# Patient Record
Sex: Female | Born: 1990 | Race: Black or African American | Hispanic: No | Marital: Single | Smoking: Never smoker
Health system: Southern US, Community
[De-identification: ages and names within clinical notes are randomized; demographics above are authoritative.]

## PROBLEM LIST (undated history)

## (undated) ENCOUNTER — Inpatient Hospital Stay (HOSPITAL_COMMUNITY): Payer: Self-pay

## (undated) DIAGNOSIS — I10 Essential (primary) hypertension: Secondary | ICD-10-CM

---

## 2017-07-30 ENCOUNTER — Encounter: Payer: Self-pay | Admitting: *Deleted

## 2017-08-12 LAB — OB RESULTS CONSOLE GC/CHLAMYDIA: Gonorrhea: NEGATIVE

## 2017-08-25 ENCOUNTER — Encounter: Payer: Medicaid Other | Admitting: Advanced Practice Midwife

## 2017-08-25 ENCOUNTER — Encounter: Payer: Self-pay | Admitting: Advanced Practice Midwife

## 2017-09-04 ENCOUNTER — Encounter (HOSPITAL_COMMUNITY): Payer: Self-pay

## 2017-09-04 ENCOUNTER — Inpatient Hospital Stay (HOSPITAL_COMMUNITY)
Admission: AD | Admit: 2017-09-04 | Discharge: 2017-09-05 | Disposition: A | Discharge status: Home / Self Care | Payer: Medicaid Other | Source: Ambulatory Visit | Attending: Obstetrics and Gynecology | Admitting: Obstetrics and Gynecology

## 2017-09-04 DIAGNOSIS — O209 Hemorrhage in early pregnancy, unspecified: Secondary | ICD-10-CM | POA: Insufficient documentation

## 2017-09-04 DIAGNOSIS — Z6791 Unspecified blood type, Rh negative: Secondary | ICD-10-CM

## 2017-09-04 DIAGNOSIS — Z3A10 10 weeks gestation of pregnancy: Secondary | ICD-10-CM | POA: Insufficient documentation

## 2017-09-04 DIAGNOSIS — O208 Other hemorrhage in early pregnancy: Secondary | ICD-10-CM | POA: Diagnosis not present

## 2017-09-04 HISTORY — DX: Essential (primary) hypertension: I10

## 2017-09-04 LAB — WET PREP, GENITAL
Clue Cells Wet Prep HPF POC: NONE SEEN
Sperm: NONE SEEN
Trich, Wet Prep: NONE SEEN
Yeast Wet Prep HPF POC: NONE SEEN

## 2017-09-04 LAB — CBC
HEMATOCRIT: 29.2 % — AB (ref 36.0–46.0)
Hemoglobin: 9.4 g/dL — ABNORMAL LOW (ref 12.0–15.0)
MCH: 23 pg — AB (ref 26.0–34.0)
MCHC: 32.2 g/dL (ref 30.0–36.0)
MCV: 71.4 fL — AB (ref 78.0–100.0)
Platelets: 324 10*3/uL (ref 150–400)
RBC: 4.09 MIL/uL (ref 3.87–5.11)
RDW: 22.4 % — AB (ref 11.5–15.5)
WBC: 11 10*3/uL — AB (ref 4.0–10.5)

## 2017-09-04 LAB — URINALYSIS, ROUTINE W REFLEX MICROSCOPIC

## 2017-09-04 LAB — POCT PREGNANCY, URINE: Preg Test, Ur: POSITIVE — AB

## 2017-09-04 LAB — URINALYSIS, MICROSCOPIC (REFLEX): RBC / HPF: >50 RBC/hpf (ref 0–5)

## 2017-09-04 LAB — ABO/RH: ABO/RH(D): A NEG

## 2017-09-04 MED ORDER — RHO D IMMUNE GLOBULIN 1500 UNIT/2ML IJ SOSY
300.0000 ug | PREFILLED_SYRINGE | Freq: Once | INTRAMUSCULAR | Status: AC
  Administered 2017-09-04: 300 ug via INTRAMUSCULAR
  Filled 2017-09-04: qty 2

## 2017-09-04 NOTE — MAU Provider Note (Signed)
History     CSN: [id number]  Arrival date and time: 09/04/17 2002   First Provider Initiated Contact with Patient 09/04/17 2124      Chief Complaint  Patient presents with  . Vaginal Bleeding   HPI Cheryl Nolan is a 27 y.o. G2P1000 at 6590w5d who presents with vaginal bleeding. She states she noticed bright red vaginal bleeding today when she wipes. Denies any pain. She reports having an ultrasound in the office at 7 weeks that confirmed an IUP with a heartbeat.   OB History    Gravida  2   Para  1   Term  1   Preterm      AB      Living        SAB      TAB      Ectopic      Multiple      Live Births              Past Medical History:  Diagnosis Date  . Hypertension     Past Surgical History:  Procedure Laterality Date  . CESAREAN SECTION      No family history on file.  Social History   Tobacco Use  . Smoking status: Never Smoker  . Smokeless tobacco: Never Used  Substance Use Topics  . Alcohol use: Not Currently  . Drug use: Never    Allergies: No Known Allergies  No medications prior to admission.    Review of Systems  Constitutional: Negative.  Negative for fatigue and fever.  HENT: Negative.   Respiratory: Negative.  Negative for shortness of breath.   Cardiovascular: Negative.  Negative for chest pain.  Gastrointestinal: Negative.  Negative for abdominal pain, constipation, diarrhea, nausea and vomiting.  Genitourinary: Positive for vaginal bleeding. Negative for dysuria.  Neurological: Negative.  Negative for dizziness and headaches.   Physical Exam   Blood pressure 118/77, pulse 99, temperature 99.6 F (37.6 C), temperature source Oral, resp. rate 18, height 5\' 1"  (1.549 m), weight 122.9 kg, last menstrual period 06/21/2017, SpO2 100 %.  Physical Exam  Nursing note and vitals reviewed. Constitutional: She is oriented to person, place, and time. She appears well-developed and well-nourished. No distress.  HENT:  Head:  Normocephalic.  Eyes: Pupils are equal, round, and reactive to light.  Cardiovascular: Normal rate, regular rhythm and normal heart sounds.  Respiratory: Effort normal and breath sounds normal. No respiratory distress.  GI: Soft. Bowel sounds are normal. She exhibits no distension. There is no tenderness.  Genitourinary:  Genitourinary Comments: Pelvic exam: Cervix pink, visually closed, without lesion, scant bright red bleeding at os, vaginal walls and external genitalia normal Bimanual exam: Cervix 0/long/high, firm, anterior, neg CMT, uterus nontender, adnexa without tenderness, enlargement, or mass   Neurological: She is alert and oriented to person, place, and time.  Skin: Skin is warm and dry.  Psychiatric: She has a normal mood and affect. Her behavior is normal. Judgment and thought content normal.    MAU Course  Procedures Results for orders placed or performed during the hospital encounter of 09/04/17 (from the past 24 hour(s))  Urinalysis, Routine w reflex microscopic     Status: Abnormal   Collection Time: 09/04/17  8:23 PM  Result Value Ref Range   Color, Urine RED (A) YELLOW   APPearance TURBID (A) CLEAR   Specific Gravity, Urine  1.005 - 1.030    TEST NOT REPORTED DUE TO COLOR INTERFERENCE OF URINE PIGMENT   pH  5.0 - 8.0    TEST NOT REPORTED DUE TO COLOR INTERFERENCE OF URINE PIGMENT   Glucose, UA (A) NEGATIVE mg/dL    TEST NOT REPORTED DUE TO COLOR INTERFERENCE OF URINE PIGMENT   Hgb urine dipstick (A) NEGATIVE    TEST NOT REPORTED DUE TO COLOR INTERFERENCE OF URINE PIGMENT   Bilirubin Urine (A) NEGATIVE    TEST NOT REPORTED DUE TO COLOR INTERFERENCE OF URINE PIGMENT   Ketones, ur (A) NEGATIVE mg/dL    TEST NOT REPORTED DUE TO COLOR INTERFERENCE OF URINE PIGMENT   Protein, ur (A) NEGATIVE mg/dL    TEST NOT REPORTED DUE TO COLOR INTERFERENCE OF URINE PIGMENT   Nitrite (A) NEGATIVE    TEST NOT REPORTED DUE TO COLOR INTERFERENCE OF URINE PIGMENT   Leukocytes, UA  (A) NEGATIVE    TEST NOT REPORTED DUE TO COLOR INTERFERENCE OF URINE PIGMENT  Urinalysis, Microscopic (reflex)     Status: Abnormal   Collection Time: 09/04/17  8:23 PM  Result Value Ref Range   RBC / HPF >50 0 - 5 RBC/hpf   WBC, UA 6-10 0 - 5 WBC/hpf   Bacteria, UA FEW (A) NONE SEEN   Squamous Epithelial / LPF 0-5 0 - 5   Urine-Other MICROSCOPIC EXAM PERFORMED ON UNCONCENTRATED URINE   Pregnancy, urine POC     Status: Abnormal   Collection Time: 09/04/17  8:40 PM  Result Value Ref Range   Preg Test, Ur POSITIVE (A) NEGATIVE  CBC     Status: Abnormal   Collection Time: 09/04/17  9:35 PM  Result Value Ref Range   WBC 11.0 (H) 4.0 - 10.5 K/uL   RBC 4.09 3.87 - 5.11 MIL/uL   Hemoglobin 9.4 (L) 12.0 - 15.0 g/dL   HCT 95.2 (L) 84.1 - 32.4 %   MCV 71.4 (L) 78.0 - 100.0 fL   MCH 23.0 (L) 26.0 - 34.0 pg   MCHC 32.2 30.0 - 36.0 g/dL   RDW 40.1 (H) 02.7 - 25.3 %   Platelets 324 150 - 400 K/uL  Rh IG workup (includes ABO/Rh)     Status: None (Preliminary result)   Collection Time: 09/04/17  9:36 PM  Result Value Ref Range   Gestational Age(Wks) 10.5    ABO/RH(D) A NEG    Antibody Screen NEG    Unit Number [id number]    Blood Component Type RHIG    Unit division 00    Status of Unit ISSUED    Transfusion Status      OK TO TRANSFUSE Performed at [facility], [address], [place], [place] [address]   ABO/Rh     Status: None   Collection Time: 09/04/17  9:36 PM  Result Value Ref Range   ABO/RH(D)      A NEG Performed at [facility], [address] [place], [place] [address]   Wet prep, genital     Status: Abnormal   Collection Time: 09/04/17  9:49 PM  Result Value Ref Range   Yeast Wet Prep HPF POC NONE SEEN NONE SEEN   Trich, Wet Prep NONE SEEN NONE SEEN   Clue Cells Wet Prep HPF POC NONE SEEN NONE SEEN   WBC, Wet Prep HPF POC MODERATE (A) NONE SEEN   Sperm NONE SEEN    MDM Prenatal records from private office not on file Labs ordered and  reviewed.  CBC Rhogam work up [employer] prep and gc/chlamydia Bedside u/s confirmed IUP with FHR 162 bpm  Consulted with  Dr. Dareen PianoAnderson- ok to discharge patient home to follow up in the office.   Assessment and Plan   1. Vaginal bleeding affecting early pregnancy   2. [redacted] weeks gestation of pregnancy   3. Blood type, Rh negative    -Discharge home in stable condition -Vaginal bleeding precautions discussed -Patient [facility] -Patient may return to MAU as needed or if her condition were to change or worsen  Rolm BookbinderCaroline M Gust Eugene CNM 09/04/2017, 9:25 PM

## 2017-09-04 NOTE — MAU Note (Signed)
Pt states she is 10.[redacted] weeks pregnant. States she went to the bathroom and noticed a golf ball sized clot. Pt states she is now having bright red bleeding like a light period. Pt reports some mild cramping, rates 2/10. LMP: 06/21/2017.

## 2017-09-04 NOTE — Discharge Instructions (Signed)

## 2017-09-04 NOTE — MAU Note (Signed)
Urine in lab 

## 2017-09-05 LAB — RH IG WORKUP (INCLUDES ABO/RH)
ABO/RH(D): A NEG
ANTIBODY SCREEN: NEGATIVE
Gestational Age(Wks): 10.5
UNIT DIVISION: 0

## 2017-09-05 LAB — GC/CHLAMYDIA PROBE AMP (~~LOC~~) NOT AT ARMC
Chlamydia: NEGATIVE
NEISSERIA GONORRHEA: NEGATIVE

## 2017-09-26 LAB — OB RESULTS CONSOLE HEPATITIS B SURFACE ANTIGEN: Hepatitis B Surface Ag: NEGATIVE

## 2017-09-26 LAB — OB RESULTS CONSOLE RUBELLA ANTIBODY, IGM: Rubella: NON-IMMUNE/NOT IMMUNE

## 2017-09-26 LAB — OB RESULTS CONSOLE ABO/RH: RH Type: POSITIVE

## 2017-09-26 LAB — OB RESULTS CONSOLE HIV ANTIBODY (ROUTINE TESTING): HIV: NONREACTIVE

## 2017-09-26 LAB — OB RESULTS CONSOLE RPR: RPR: NONREACTIVE

## 2017-11-17 ENCOUNTER — Encounter (HOSPITAL_COMMUNITY): Payer: Self-pay | Admitting: *Deleted

## 2017-11-17 ENCOUNTER — Other Ambulatory Visit: Payer: Self-pay

## 2017-11-17 ENCOUNTER — Inpatient Hospital Stay (HOSPITAL_COMMUNITY)
Admission: AD | Admit: 2017-11-17 | Discharge: 2017-11-17 | Disposition: A | Discharge status: Home / Self Care | Payer: Medicaid Other | Source: Ambulatory Visit | Attending: Obstetrics | Admitting: Obstetrics

## 2017-11-17 DIAGNOSIS — Z3A21 21 weeks gestation of pregnancy: Secondary | ICD-10-CM | POA: Diagnosis not present

## 2017-11-17 DIAGNOSIS — R102 Pelvic and perineal pain unspecified side: Secondary | ICD-10-CM

## 2017-11-17 DIAGNOSIS — O26892 Other specified pregnancy related conditions, second trimester: Secondary | ICD-10-CM | POA: Insufficient documentation

## 2017-11-17 DIAGNOSIS — R109 Unspecified abdominal pain: Secondary | ICD-10-CM | POA: Diagnosis present

## 2017-11-17 DIAGNOSIS — O26899 Other specified pregnancy related conditions, unspecified trimester: Secondary | ICD-10-CM

## 2017-11-17 LAB — URINALYSIS, ROUTINE W REFLEX MICROSCOPIC
BILIRUBIN URINE: NEGATIVE
GLUCOSE, UA: NEGATIVE mg/dL
Hgb urine dipstick: NEGATIVE
KETONES UR: 5 mg/dL — AB
Nitrite: NEGATIVE
PH: 5 (ref 5.0–8.0)
Protein, ur: 30 mg/dL — AB
Specific Gravity, Urine: 1.029 (ref 1.005–1.030)

## 2017-11-17 LAB — WET PREP, GENITAL
Clue Cells Wet Prep HPF POC: NONE SEEN
Sperm: NONE SEEN
Trich, Wet Prep: NONE SEEN
Yeast Wet Prep HPF POC: NONE SEEN

## 2017-11-17 NOTE — Discharge Instructions (Signed)
Round Ligament Pain °The round ligament is a cord of muscle and tissue that helps to support the uterus. It can become a source of pain during pregnancy if it becomes stretched or twisted as the baby grows. The pain usually begins in the second trimester of pregnancy, and it can come and go until the baby is delivered. It is not a serious problem, and it does not cause harm to the baby. °Round ligament pain is usually a short, sharp, and pinching pain, but it can also be a dull, lingering, and aching pain. The pain is felt in the lower side of the abdomen or in the groin. It usually starts deep in the groin and moves up to the outside of the hip area. Pain can occur with: °· A sudden change in position. °· Rolling over in bed. °· Coughing or sneezing. °· Physical activity. ° °Follow these instructions at home: °Watch your condition for any changes. Take these steps to help with your pain: °· When the pain starts, relax. Then try: °? Sitting down. °? Flexing your knees up to your abdomen. °? Lying on your side with one pillow under your abdomen and another pillow between your legs. °? Sitting in a warm bath for 15-20 minutes or until the pain goes away. °· Take over-the-counter and prescription medicines only as told by your health care provider. °· Move slowly when you sit and stand. °· Avoid long walks if they cause pain. °· Stop or lessen your physical activities if they cause pain. ° °Contact a health care provider if: °· Your pain does not go away with treatment. °· You feel pain in your back that you did not have before. °· Your medicine is not helping. °Get help right away if: °· You develop a fever or chills. °· You develop uterine contractions. °· You develop vaginal bleeding. °· You develop nausea or vomiting. °· You develop diarrhea. °· You have pain when you urinate. °This information is not intended to replace advice given to you by your health care provider. Make sure you discuss any questions you have  with your health care provider. °Document Released: 10/17/2007 Document Revised: 06/15/2015 Document Reviewed: 03/16/2014 °Elsevier Interactive Patient Education © 2018 [employer] ° °Safe Medications in Pregnancy  ° °Acne: °Benzoyl Peroxide °Salicylic Acid ° °Backache/Headache: °Tylenol: 2 regular strength every 4 hours OR °             2 Extra strength every 6 hours ° °Colds/Coughs/Allergies: °Benadryl (alcohol free) 25 mg every 6 hours as needed °Breath right strips °Claritin °Cepacol throat lozenges °Chloraseptic throat spray °Cold-Eeze- up to three times per day °Cough drops, alcohol free °Flonase (by prescription only) °Guaifenesin °Mucinex °Robitussin DM (plain only, alcohol free) °Saline nasal spray/drops °Sudafed (pseudoephedrine) & Actifed ** use only after [redacted] weeks gestation and if you do not have high blood pressure °Tylenol °Vicks Vaporub °Zinc lozenges °Zyrtec  ° °Constipation: °Colace °Ducolax suppositories °Fleet enema °Glycerin suppositories °Metamucil °Milk of magnesia °Miralax °Senokot °Smooth move tea ° °Diarrhea: °Kaopectate °Imodium A-D ° °*NO pepto Bismol ° °Hemorrhoids: °Anusol °Anusol HC °Preparation H °Tucks ° °Indigestion: °Tums °Maalox °Mylanta °Zantac  °Pepcid ° °Insomnia: °Benadryl (alcohol free) 25mg every 6 hours as needed °Tylenol PM °Unisom, no Gelcaps ° °Leg Cramps: °Tums °MagGel ° °Nausea/Vomiting:  °Bonine °Dramamine °Emetrol °Ginger extract °Sea bands °Meclizine  °Nausea medication to take during pregnancy:  °Unisom (doxylamine succinate 25 mg tablets) Take one tablet daily at bedtime. If symptoms are not adequately controlled,   the dose can be increased to a maximum recommended dose of two tablets daily (1/2 tablet in the morning, 1/2 tablet mid-afternoon and one at bedtime). °Vitamin B6 100mg tablets. Take one tablet twice a day (up to 200 mg per day). ° °Skin Rashes: °Aveeno products °Benadryl cream or 25mg every 6 hours as needed °Calamine Lotion °1% cortisone  cream ° °Yeast infection: °Gyne-lotrimin 7 °Monistat 7 ° ° °**If taking multiple medications, please check labels to avoid duplicating the same active ingredients °**take medication as directed on the label °** Do not exceed 4000 mg of tylenol in 24 hours °**Do not take medications that contain aspirin or ibuprofen ° ° ° ° °

## 2017-11-17 NOTE — MAU Note (Signed)
First it was like pressure, now is feeling like cramping in her lower abd, really hurts. No bleeding or leaking.

## 2017-11-17 NOTE — MAU Provider Note (Addendum)
History     CSN: [id number]  Arrival date and time: 11/17/17 1231   First Provider Initiated Contact with Patient 11/17/17 1258      Chief Complaint  Patient presents with  . Abdominal Pain   HPI Cheryl Nolan is a 27 y.o. G2P1000 at 108w2d who presents with lower abdominal pressure and cramping. She states she spoke with her OB who told her the pressure was normal but started having cramping today. She rates the cramping a 3/10 and has not tried anything for the pain. She states the pain is worse with movement and when she changes positions. She denies any vaginal bleeding or discharge. Reports normal fetal movement. She reports one glass of water today.   She states she had a normal anatomy ultrasound with a posterior placenta location.   OB History    Gravida  2   Para  1   Term  1   Preterm      AB      Living        SAB      TAB      Ectopic      Multiple      Live Births              Past Medical History:  Diagnosis Date  . Hypertension     Past Surgical History:  Procedure Laterality Date  . CESAREAN SECTION      History reviewed. No pertinent family history.  Social History   Tobacco Use  . Smoking status: Never Smoker  . Smokeless tobacco: Never Used  Substance Use Topics  . Alcohol use: Not Currently  . Drug use: Never    Allergies: No Known Allergies  No medications prior to admission.    Review of Systems  Constitutional: Negative.  Negative for fatigue and fever.  HENT: Negative.   Respiratory: Negative.  Negative for shortness of breath.   Cardiovascular: Negative.  Negative for chest pain.  Gastrointestinal: Positive for abdominal pain. Negative for constipation, diarrhea, nausea and vomiting.  Genitourinary: Negative.  Negative for dysuria, frequency, vaginal bleeding and vaginal discharge.  Musculoskeletal: Negative for back pain.  Neurological: Negative.  Negative for dizziness and headaches.   Physical Exam    Blood pressure 132/62, pulse 98, temperature 98.1 F (36.7 C), temperature source Oral, resp. rate 18, weight 123.9 kg, last menstrual period 06/21/2017, SpO2 99 %.  Physical Exam  Nursing note and vitals reviewed. Constitutional: She is oriented to person, place, and time. She appears well-developed and well-nourished. No distress.  HENT:  Head: Normocephalic.  Eyes: Pupils are equal, round, and reactive to light.  Cardiovascular: Normal rate, regular rhythm and normal heart sounds.  Respiratory: Effort normal and breath sounds normal. No respiratory distress.  GI: Soft. Bowel sounds are normal. She exhibits no distension. There is no tenderness. There is no rebound and no guarding.  Neurological: She is alert and oriented to person, place, and time.  Skin: Skin is warm and dry.  Psychiatric: She has a normal mood and affect. Her behavior is normal. Judgment and thought content normal.   Dilation: Closed Effacement (%): Thick Cervical Position: Posterior Exam by:: Ma Hillock CNM  FHT: 154 bpm  MAU Course  Procedures Results for orders placed or performed during the hospital encounter of 11/17/17 (from the past 24 hour(s))  Urinalysis, Routine w reflex microscopic     Status: Abnormal   Collection Time: 11/17/17 12:55 PM  Result Value Ref Range   Color,  Urine YELLOW YELLOW   APPearance HAZY (A) CLEAR   Specific Gravity, Urine 1.029 1.005 - 1.030   pH 5.0 5.0 - 8.0   Glucose, UA NEGATIVE NEGATIVE mg/dL   Hgb urine dipstick NEGATIVE NEGATIVE   Bilirubin Urine NEGATIVE NEGATIVE   Ketones, ur 5 (A) NEGATIVE mg/dL   Protein, ur 30 (A) NEGATIVE mg/dL   Nitrite NEGATIVE NEGATIVE   Leukocytes, UA SMALL (A) NEGATIVE   RBC / HPF 0-5 0 - 5 RBC/hpf   WBC, UA 0-5 0 - 5 WBC/hpf   Bacteria, UA RARE (A) NONE SEEN   Squamous Epithelial / LPF 6-10 0 - 5   Mucus PRESENT   Wet prep, genital     Status: Abnormal   Collection Time: 11/17/17  1:08 PM  Result Value Ref Range   Yeast Wet  Prep HPF POC NONE SEEN NONE SEEN   Trich, Wet Prep NONE SEEN NONE SEEN   Clue Cells Wet Prep HPF POC NONE SEEN NONE SEEN   WBC, Wet Prep HPF POC FEW (A) NONE SEEN   Sperm NONE SEEN     MDM Prenatal records from private office not on file. Pregnancy complicated by first trimester bleeding. Labs ordered and reviewed.  UA Wet prep and gc/chlamydia  Discussed with patient increasing PO hydration and using a maternity support belt daily. Encouraged patient to keep appointment as scheduled for prenatal care. Will call patient if gc/chlamydia is positive.   Assessment and Plan   1. Pain of round ligament affecting pregnancy, antepartum   2. [redacted] weeks gestation of pregnancy    -Discharge home in stable condition -Preterm labor precautions discussed -Patient advised to follow-up with [facility] as scheduled for prenatal care -Patient may return to MAU as needed or if her condition were to change or worsen  Rolm Bookbinder CNM 11/17/2017, 12:58 PM

## 2017-11-18 LAB — GC/CHLAMYDIA PROBE AMP (~~LOC~~) NOT AT ARMC
Chlamydia: NEGATIVE
Neisseria Gonorrhea: NEGATIVE

## 2017-12-12 ENCOUNTER — Encounter (HOSPITAL_COMMUNITY): Payer: Self-pay

## 2017-12-12 ENCOUNTER — Other Ambulatory Visit (HOSPITAL_COMMUNITY): Payer: Self-pay | Admitting: Obstetrics and Gynecology

## 2017-12-12 DIAGNOSIS — Z3689 Encounter for other specified antenatal screening: Secondary | ICD-10-CM

## 2017-12-15 ENCOUNTER — Encounter (HOSPITAL_COMMUNITY): Payer: Self-pay

## 2017-12-15 ENCOUNTER — Ambulatory Visit (HOSPITAL_COMMUNITY)
Admission: RE | Admit: 2017-12-15 | Discharge: 2017-12-15 | Disposition: A | Discharge status: Home / Self Care | Payer: Medicaid Other | Source: Ambulatory Visit | Attending: Obstetrics and Gynecology | Admitting: Obstetrics and Gynecology

## 2017-12-15 DIAGNOSIS — O99213 Obesity complicating pregnancy, third trimester: Secondary | ICD-10-CM | POA: Diagnosis not present

## 2017-12-15 DIAGNOSIS — Z3689 Encounter for other specified antenatal screening: Secondary | ICD-10-CM | POA: Insufficient documentation

## 2017-12-15 DIAGNOSIS — O34219 Maternal care for unspecified type scar from previous cesarean delivery: Secondary | ICD-10-CM | POA: Diagnosis not present

## 2017-12-15 DIAGNOSIS — O09293 Supervision of pregnancy with other poor reproductive or obstetric history, third trimester: Secondary | ICD-10-CM

## 2017-12-15 DIAGNOSIS — O99212 Obesity complicating pregnancy, second trimester: Secondary | ICD-10-CM | POA: Diagnosis not present

## 2017-12-15 DIAGNOSIS — Z3A25 25 weeks gestation of pregnancy: Secondary | ICD-10-CM | POA: Insufficient documentation

## 2017-12-15 DIAGNOSIS — Z363 Encounter for antenatal screening for malformations: Secondary | ICD-10-CM | POA: Diagnosis not present

## 2018-01-16 ENCOUNTER — Other Ambulatory Visit (HOSPITAL_COMMUNITY): Payer: Self-pay | Admitting: *Deleted

## 2018-01-16 ENCOUNTER — Encounter (HOSPITAL_COMMUNITY): Payer: Medicaid Other

## 2018-01-19 ENCOUNTER — Ambulatory Visit (HOSPITAL_COMMUNITY)
Admission: RE | Admit: 2018-01-19 | Discharge: 2018-01-19 | Disposition: A | Discharge status: Home / Self Care | Payer: Medicaid Other | Source: Ambulatory Visit | Attending: Obstetrics and Gynecology | Admitting: Obstetrics and Gynecology

## 2018-01-19 DIAGNOSIS — O99019 Anemia complicating pregnancy, unspecified trimester: Secondary | ICD-10-CM | POA: Diagnosis not present

## 2018-01-19 MED ORDER — SODIUM CHLORIDE 0.9 % IV SOLN
510.0000 mg | INTRAVENOUS | Status: DC
  Administered 2018-01-19: 510 mg via INTRAVENOUS
  Filled 2018-01-19: qty 17

## 2018-01-19 NOTE — Discharge Instructions (Signed)

## 2018-01-26 ENCOUNTER — Ambulatory Visit (HOSPITAL_COMMUNITY)
Admission: RE | Admit: 2018-01-26 | Discharge: 2018-01-26 | Disposition: A | Discharge status: Home / Self Care | Payer: Medicaid Other | Source: Ambulatory Visit | Attending: Obstetrics and Gynecology | Admitting: Obstetrics and Gynecology

## 2018-01-26 DIAGNOSIS — O99019 Anemia complicating pregnancy, unspecified trimester: Secondary | ICD-10-CM | POA: Insufficient documentation

## 2018-01-26 MED ORDER — SODIUM CHLORIDE 0.9 % IV SOLN
510.0000 mg | INTRAVENOUS | Status: DC
  Administered 2018-01-26: 510 mg via INTRAVENOUS
  Filled 2018-01-26: qty 510

## 2018-03-26 ENCOUNTER — Other Ambulatory Visit: Payer: Self-pay | Admitting: Obstetrics and Gynecology

## 2018-03-26 ENCOUNTER — Encounter (HOSPITAL_COMMUNITY): Payer: Self-pay | Admitting: *Deleted

## 2018-03-26 NOTE — Patient Instructions (Signed)
Cheryl Nolan  03/26/2018   Your procedure is scheduled on:  03/29/2018  Arrive at 0530 at Entrance C on [employer] at [facility] and [employer]. You are invited to use the FREE valet parking or use the Visitor's parking deck.  Pick up the phone at the desk and dial [phone].  Call this number if you have problems the morning of surgery: [phone]  Remember:   Do not eat food:(After Midnight) Desps de medianoche.  Do not drink clear liquids: (After Midnight) Desps de medianoche.  Take these medicines the morning of surgery with A SIP OF WATER:  none   Do not wear jewelry, make-up or nail polish.  Do not wear lotions, powders, or perfumes. Do not wear deodorant.  Do not shave 48 hours prior to surgery.  Do not bring valuables to the hospital.  [facility] is not   responsible for any belongings or valuables brought to the hospital.  Contacts, dentures or bridgework may not be worn into surgery.  Leave suitcase in the car. After surgery it may be brought to your room.  For patients admitted to the hospital, checkout time is 11:00 AM the day of              discharge.      Please read over the following fact sheets that you were given:     Preparing for Surgery

## 2018-03-27 ENCOUNTER — Encounter (HOSPITAL_COMMUNITY)
Admission: RE | Admit: 2018-03-27 | Discharge: 2018-03-27 | Disposition: A | Discharge status: Home / Self Care | Payer: Medicaid Other | Source: Ambulatory Visit | Attending: Obstetrics and Gynecology | Admitting: Obstetrics and Gynecology

## 2018-03-27 LAB — CBC
HEMATOCRIT: 33.8 % — AB (ref 36.0–46.0)
Hemoglobin: 10.5 g/dL — ABNORMAL LOW (ref 12.0–15.0)
MCH: 25.3 pg — ABNORMAL LOW (ref 26.0–34.0)
MCHC: 31.1 g/dL (ref 30.0–36.0)
MCV: 81.4 fL (ref 80.0–100.0)
NRBC: 0 % (ref 0.0–0.2)
Platelets: 229 10*3/uL (ref 150–400)
RBC: 4.15 MIL/uL (ref 3.87–5.11)
RDW: 14.9 % (ref 11.5–15.5)
WBC: 8 10*3/uL (ref 4.0–10.5)

## 2018-03-27 LAB — TYPE AND SCREEN
ABO/RH(D): A NEG
Antibody Screen: NEGATIVE
Weak D: POSITIVE

## 2018-03-27 LAB — ABO/RH: ABO/RH(D): A NEG

## 2018-03-28 LAB — RPR: RPR Ser Ql: NONREACTIVE

## 2018-03-28 NOTE — H&P (Signed)
28 y.o.  G3P1011 [redacted]w[redacted]d comes in for a repeat cesarean section at term.  Patient has good fetal movement and no bleeding.  Unfavorable cervix and suspected LGA fetus despite [place] at 36 weeks showing only 5.5 pounds.   Past Medical History:  Diagnosis Date  . Hypertension     Past Surgical History:  Procedure Laterality Date  . CESAREAN SECTION      OB History  Gravida Para Term Preterm AB Living  3 1 1   1 1   SAB TAB Ectopic Multiple Live Births    1          # Outcome Date GA Lbr Len/2nd Weight Sex Delivery Anes PTL Lv  3 Current           2 Term 10/30/10     CS-Unspec     1 TAB             Social History   Socioeconomic History  . Marital status: Single    Spouse name: Not on file  . Number of children: Not on file  . Years of education: Not on file  . Highest education level: Not on file  Occupational History  . Not on file  Social Needs  . Financial resource strain: Not hard at all  . Food insecurity:    Worry: Never true    Inability: Never true  . Transportation needs:    Medical: No    Non-medical: Not on file  Tobacco Use  . Smoking status: Never Smoker  . Smokeless tobacco: Never Used  Substance and Sexual Activity  . Alcohol use: Not Currently  . Drug use: Never  . Sexual activity: Yes    Comment: Last intercourse 06/2017  Lifestyle  . Physical activity:    Days per week: Not on file    Minutes per session: Not on file  . Stress: Only a little  Relationships  . Social connections:    Talks on phone: Not on file    Gets together: Not on file    Attends religious service: Not on file    Active member of club or organization: Not on file    Attends meetings of clubs or organizations: Not on file    Relationship status: Not on file  . Intimate partner violence:    Fear of current or ex partner: No    Emotionally abused: No    Physically abused: No    Forced sexual activity: No  Other Topics Concern  . Not on file  Social History Narrative  . Not  on file   Patient has no known allergies.   Prenatal Course: Uncomplicated.   Prenatal Transfer Tool  Maternal Diabetes: No Genetic Screening: Normal Maternal Ultrasounds/Referrals: Normal Fetal Ultrasounds or other Referrals:  None Maternal Substance Abuse:  No Significant Maternal Medications:  Meds include: Other: s/p iron infusion Significant Maternal Lab Results: Lab values include: Other: weak D - neg in office but received Rhogam at [facility].  A1C and 1 hour normal.  H/H just under 10 after infusion.   Vitals:   03/26/18 1521  Weight: 127 kg  Height: 5\' 1"  (1.549 m)    Lungs/Cor:  NAD Abdomen:  soft, gravid Ex:  no cords, erythema SVE:  NA FHTs:  Present.  A/P   For repeat cesarean sectionat term.  All risks, benefits and alternatives discussed with patient and she desires to proceed.  Loney Laurence

## 2018-03-29 ENCOUNTER — Inpatient Hospital Stay (HOSPITAL_COMMUNITY)
Admission: RE | Admit: 2018-03-29 | Discharge: 2018-03-31 | DRG: 788 | Disposition: A | Discharge status: Home / Self Care | Payer: Medicaid Other | Attending: Obstetrics and Gynecology | Admitting: Obstetrics and Gynecology

## 2018-03-29 ENCOUNTER — Encounter (HOSPITAL_COMMUNITY)
Admission: RE | Disposition: A | Discharge status: Home / Self Care | Payer: Self-pay | Source: Home / Self Care | Attending: Obstetrics and Gynecology

## 2018-03-29 ENCOUNTER — Inpatient Hospital Stay (HOSPITAL_COMMUNITY): Payer: Medicaid Other | Admitting: Anesthesiology

## 2018-03-29 ENCOUNTER — Encounter (HOSPITAL_COMMUNITY): Payer: Self-pay | Admitting: General Practice

## 2018-03-29 DIAGNOSIS — Z9889 Other specified postprocedural states: Secondary | ICD-10-CM

## 2018-03-29 DIAGNOSIS — O99214 Obesity complicating childbirth: Secondary | ICD-10-CM | POA: Diagnosis present

## 2018-03-29 DIAGNOSIS — Z3A39 39 weeks gestation of pregnancy: Secondary | ICD-10-CM | POA: Diagnosis not present

## 2018-03-29 DIAGNOSIS — O34211 Maternal care for low transverse scar from previous cesarean delivery: Principal | ICD-10-CM | POA: Diagnosis present

## 2018-03-29 DIAGNOSIS — O3663X0 Maternal care for excessive fetal growth, third trimester, not applicable or unspecified: Secondary | ICD-10-CM | POA: Diagnosis present

## 2018-03-29 SURGERY — Surgical Case
Anesthesia: Spinal

## 2018-03-29 MED ORDER — SIMETHICONE 80 MG PO CHEW
80.0000 mg | CHEWABLE_TABLET | ORAL | Status: DC
  Administered 2018-03-29 – 2018-03-30 (×2): 80 mg via ORAL
  Filled 2018-03-29 (×2): qty 1

## 2018-03-29 MED ORDER — SENNOSIDES-DOCUSATE SODIUM 8.6-50 MG PO TABS
2.0000 | ORAL_TABLET | ORAL | Status: DC
  Administered 2018-03-29 – 2018-03-30 (×2): 2 via ORAL
  Filled 2018-03-29 (×2): qty 2

## 2018-03-29 MED ORDER — FENTANYL CITRATE (PF) 100 MCG/2ML IJ SOLN
25.0000 ug | INTRAMUSCULAR | Status: DC | PRN
  Administered 2018-03-29: 25 ug via INTRAVENOUS

## 2018-03-29 MED ORDER — DIPHENHYDRAMINE HCL 25 MG PO CAPS
25.0000 mg | ORAL_CAPSULE | ORAL | Status: DC | PRN
  Administered 2018-03-29: 25 mg via ORAL

## 2018-03-29 MED ORDER — ZOLPIDEM TARTRATE 5 MG PO TABS: 5.0000 mg | ORAL_TABLET | Freq: Every evening | ORAL | Status: DC | PRN

## 2018-03-29 MED ORDER — MEASLES, MUMPS & RUBELLA VAC IJ SOLR: 0.5000 mL | Freq: Once | INTRAMUSCULAR | Status: DC

## 2018-03-29 MED ORDER — DEXAMETHASONE SODIUM PHOSPHATE 4 MG/ML IJ SOLN
INTRAMUSCULAR | Status: AC
  Filled 2018-03-29: qty 1

## 2018-03-29 MED ORDER — FLEET ENEMA 7-19 GM/118ML RE ENEM: 1.0000 | ENEMA | Freq: Every day | RECTAL | Status: DC | PRN

## 2018-03-29 MED ORDER — OXYCODONE HCL 5 MG PO TABS: 5.0000 mg | ORAL_TABLET | Freq: Once | ORAL | Status: DC | PRN

## 2018-03-29 MED ORDER — NALOXONE HCL 0.4 MG/ML IJ SOLN: 0.4000 mg | INTRAMUSCULAR | Status: DC | PRN

## 2018-03-29 MED ORDER — OXYTOCIN 10 UNIT/ML IJ SOLN
INTRAVENOUS | Status: DC | PRN
  Administered 2018-03-29: 40 [IU] via INTRAVENOUS

## 2018-03-29 MED ORDER — FERROUS SULFATE 325 (65 FE) MG PO TABS
325.0000 mg | ORAL_TABLET | Freq: Two times a day (BID) | ORAL | Status: DC
  Administered 2018-03-29 – 2018-03-31 (×4): 325 mg via ORAL
  Filled 2018-03-29 (×5): qty 1

## 2018-03-29 MED ORDER — DEXTROSE 5 % IV SOLN
3.0000 g | INTRAVENOUS | Status: AC
  Administered 2018-03-29: 3 g via INTRAVENOUS
  Filled 2018-03-29: qty 3000

## 2018-03-29 MED ORDER — SCOPOLAMINE 1 MG/3DAYS TD PT72
1.0000 | MEDICATED_PATCH | Freq: Once | TRANSDERMAL | Status: DC
  Filled 2018-03-29: qty 1

## 2018-03-29 MED ORDER — MENTHOL 3 MG MT LOZG: 1.0000 | LOZENGE | OROMUCOSAL | Status: DC | PRN

## 2018-03-29 MED ORDER — PHENYLEPHRINE HCL-NACL 20-0.9 MG/250ML-% IV SOLN
INTRAVENOUS | Status: DC | PRN
  Administered 2018-03-29: 60 ug/min via INTRAVENOUS

## 2018-03-29 MED ORDER — TETANUS-DIPHTH-ACELL PERTUSSIS 5-2.5-18.5 LF-MCG/0.5 IM SUSP: 0.5000 mL | Freq: Once | INTRAMUSCULAR | Status: DC

## 2018-03-29 MED ORDER — ONDANSETRON HCL 4 MG/2ML IJ SOLN: 4.0000 mg | Freq: Three times a day (TID) | INTRAMUSCULAR | Status: DC | PRN

## 2018-03-29 MED ORDER — LACTATED RINGERS IV SOLN
INTRAVENOUS | Status: DC
  Administered 2018-03-29: 22:00:00 via INTRAVENOUS

## 2018-03-29 MED ORDER — NALBUPHINE HCL 10 MG/ML IJ SOLN: 5.0000 mg | Freq: Once | INTRAMUSCULAR | Status: DC | PRN

## 2018-03-29 MED ORDER — OXYTOCIN 40 UNITS IN NORMAL SALINE INFUSION - SIMPLE MED
INTRAVENOUS | Status: AC
  Filled 2018-03-29: qty 1000

## 2018-03-29 MED ORDER — SODIUM CHLORIDE 0.9 % IR SOLN
Status: DC | PRN
  Administered 2018-03-29: 1

## 2018-03-29 MED ORDER — SOD CITRATE-CITRIC ACID 500-334 MG/5ML PO SOLN
ORAL | Status: AC
  Filled 2018-03-29: qty 15

## 2018-03-29 MED ORDER — MORPHINE SULFATE (PF) 0.5 MG/ML IJ SOLN
INTRAMUSCULAR | Status: DC | PRN
  Administered 2018-03-29: .15 mg via INTRATHECAL

## 2018-03-29 MED ORDER — SODIUM BICARBONATE 8.4 % IV SOLN
INTRAVENOUS | Status: AC
  Filled 2018-03-29: qty 50

## 2018-03-29 MED ORDER — DEXTROSE 5 % IV SOLN
INTRAVENOUS | Status: AC
  Filled 2018-03-29: qty 3000

## 2018-03-29 MED ORDER — BUPIVACAINE IN DEXTROSE 0.75-8.25 % IT SOLN
INTRATHECAL | Status: DC | PRN
  Administered 2018-03-29: 1.4 mL via INTRATHECAL

## 2018-03-29 MED ORDER — MEPERIDINE HCL 25 MG/ML IJ SOLN: 6.2500 mg | INTRAMUSCULAR | Status: DC | PRN

## 2018-03-29 MED ORDER — SODIUM CHLORIDE 0.9 % IV SOLN
INTRAVENOUS | Status: DC | PRN
  Administered 2018-03-29: 08:00:00 via INTRAVENOUS

## 2018-03-29 MED ORDER — PRENATAL MULTIVITAMIN CH
1.0000 | ORAL_TABLET | Freq: Every day | ORAL | Status: DC
  Administered 2018-03-29: 1 via ORAL
  Filled 2018-03-29 (×3): qty 1

## 2018-03-29 MED ORDER — ONDANSETRON HCL 4 MG/2ML IJ SOLN
INTRAMUSCULAR | Status: AC
  Filled 2018-03-29: qty 2

## 2018-03-29 MED ORDER — COCONUT OIL OIL
1.0000 "application " | TOPICAL_OIL | Status: DC | PRN
  Administered 2018-03-30: 1 via TOPICAL

## 2018-03-29 MED ORDER — ONDANSETRON HCL 4 MG/2ML IJ SOLN
INTRAMUSCULAR | Status: DC | PRN
  Administered 2018-03-29: 4 mg via INTRAVENOUS

## 2018-03-29 MED ORDER — ACETAMINOPHEN 325 MG PO TABS: 325.0000 mg | ORAL_TABLET | ORAL | Status: DC | PRN

## 2018-03-29 MED ORDER — FENTANYL CITRATE (PF) 100 MCG/2ML IJ SOLN
INTRAMUSCULAR | Status: AC
  Filled 2018-03-29: qty 2

## 2018-03-29 MED ORDER — KETOROLAC TROMETHAMINE 30 MG/ML IJ SOLN: 30.0000 mg | Freq: Four times a day (QID) | INTRAMUSCULAR | Status: AC | PRN

## 2018-03-29 MED ORDER — SOD CITRATE-CITRIC ACID 500-334 MG/5ML PO SOLN
30.0000 mL | ORAL | Status: AC
  Administered 2018-03-29: 30 mL via ORAL

## 2018-03-29 MED ORDER — SODIUM CHLORIDE 0.9% FLUSH: 3.0000 mL | INTRAVENOUS | Status: DC | PRN

## 2018-03-29 MED ORDER — METHYLERGONOVINE MALEATE 0.2 MG/ML IJ SOLN: 0.2000 mg | INTRAMUSCULAR | Status: DC | PRN

## 2018-03-29 MED ORDER — DIBUCAINE 1 % RE OINT: 1.0000 "application " | TOPICAL_OINTMENT | RECTAL | Status: DC | PRN

## 2018-03-29 MED ORDER — METHYLERGONOVINE MALEATE 0.2 MG PO TABS: 0.2000 mg | ORAL_TABLET | ORAL | Status: DC | PRN

## 2018-03-29 MED ORDER — ACETAMINOPHEN 160 MG/5ML PO SOLN: 325.0000 mg | ORAL | Status: DC | PRN

## 2018-03-29 MED ORDER — NALBUPHINE HCL 10 MG/ML IJ SOLN: 5.0000 mg | INTRAMUSCULAR | Status: DC | PRN

## 2018-03-29 MED ORDER — ONDANSETRON HCL 4 MG/2ML IJ SOLN: 4.0000 mg | Freq: Once | INTRAMUSCULAR | Status: DC | PRN

## 2018-03-29 MED ORDER — MORPHINE SULFATE (PF) 0.5 MG/ML IJ SOLN
INTRAMUSCULAR | Status: AC
  Filled 2018-03-29: qty 10

## 2018-03-29 MED ORDER — OXYTOCIN 40 UNITS IN NORMAL SALINE INFUSION - SIMPLE MED: 2.5000 [IU]/h | INTRAVENOUS | Status: AC

## 2018-03-29 MED ORDER — NALOXONE HCL 4 MG/10ML IJ SOLN
1.0000 ug/kg/h | INTRAVENOUS | Status: DC | PRN
  Filled 2018-03-29: qty 5

## 2018-03-29 MED ORDER — LACTATED RINGERS IV SOLN
INTRAVENOUS | Status: DC
  Administered 2018-03-29 (×2): via INTRAVENOUS

## 2018-03-29 MED ORDER — DIPHENHYDRAMINE HCL 50 MG/ML IJ SOLN: 12.5000 mg | INTRAMUSCULAR | Status: DC | PRN

## 2018-03-29 MED ORDER — SIMETHICONE 80 MG PO CHEW
80.0000 mg | CHEWABLE_TABLET | Freq: Three times a day (TID) | ORAL | Status: DC
  Administered 2018-03-29 – 2018-03-31 (×5): 80 mg via ORAL
  Filled 2018-03-29 (×6): qty 1

## 2018-03-29 MED ORDER — SIMETHICONE 80 MG PO CHEW: 80.0000 mg | CHEWABLE_TABLET | ORAL | Status: DC | PRN

## 2018-03-29 MED ORDER — OXYCODONE-ACETAMINOPHEN 5-325 MG PO TABS
1.0000 | ORAL_TABLET | ORAL | Status: DC | PRN
  Administered 2018-03-29: 1 via ORAL
  Administered 2018-03-30: 2 via ORAL
  Administered 2018-03-31 (×2): 1 via ORAL
  Filled 2018-03-29 (×3): qty 1
  Filled 2018-03-29: qty 2

## 2018-03-29 MED ORDER — BISACODYL 10 MG RE SUPP: 10.0000 mg | Freq: Every day | RECTAL | Status: DC | PRN

## 2018-03-29 MED ORDER — WITCH HAZEL-GLYCERIN EX PADS: 1.0000 "application " | MEDICATED_PAD | CUTANEOUS | Status: DC | PRN

## 2018-03-29 MED ORDER — DIPHENHYDRAMINE HCL 25 MG PO CAPS
25.0000 mg | ORAL_CAPSULE | Freq: Four times a day (QID) | ORAL | Status: DC | PRN
  Filled 2018-03-29: qty 1

## 2018-03-29 MED ORDER — OXYCODONE HCL 5 MG/5ML PO SOLN: 5.0000 mg | Freq: Once | ORAL | Status: DC | PRN

## 2018-03-29 MED ORDER — DEXAMETHASONE SODIUM PHOSPHATE 4 MG/ML IJ SOLN
INTRAMUSCULAR | Status: DC | PRN
  Administered 2018-03-29: 4 mg via INTRAVENOUS

## 2018-03-29 MED ORDER — IBUPROFEN 800 MG PO TABS
800.0000 mg | ORAL_TABLET | Freq: Three times a day (TID) | ORAL | Status: DC
  Administered 2018-03-29 – 2018-03-31 (×7): 800 mg via ORAL
  Filled 2018-03-29 (×7): qty 1

## 2018-03-29 MED ORDER — FENTANYL CITRATE (PF) 100 MCG/2ML IJ SOLN
INTRAMUSCULAR | Status: DC | PRN
  Administered 2018-03-29: 15 ug via INTRATHECAL

## 2018-03-29 SURGICAL SUPPLY — 40 items
BENZOIN TINCTURE PRP APPL 2/3 (GAUZE/BANDAGES/DRESSINGS) ×3 IMPLANT
CHLORAPREP W/TINT 26ML (MISCELLANEOUS) ×3 IMPLANT
CLAMP CORD UMBIL (MISCELLANEOUS) IMPLANT
CLOSURE STERI STRIP 1/2 X4 (GAUZE/BANDAGES/DRESSINGS) ×2 IMPLANT
CLOSURE WOUND 1/2 X4 (GAUZE/BANDAGES/DRESSINGS) ×1
CLOTH BEACON ORANGE TIMEOUT ST (SAFETY) ×3 IMPLANT
DRSG OPSITE POSTOP 4X10 (GAUZE/BANDAGES/DRESSINGS) ×3 IMPLANT
ELECT REM PT RETURN 9FT ADLT (ELECTROSURGICAL) ×3
ELECTRODE REM PT RTRN 9FT ADLT (ELECTROSURGICAL) ×1 IMPLANT
EXTRACTOR VACUUM BELL STYLE (SUCTIONS) IMPLANT
GAUZE SPONGE 4X4 12PLY STRL LF (GAUZE/BANDAGES/DRESSINGS) ×3 IMPLANT
GLOVE BIO SURGEON STRL SZ7 (GLOVE) ×3 IMPLANT
GLOVE BIOGEL PI IND STRL 7.0 (GLOVE) ×1 IMPLANT
GLOVE BIOGEL PI INDICATOR 7.0 (GLOVE) ×2
GOWN STRL REUS W/TWL LRG LVL3 (GOWN DISPOSABLE) ×6 IMPLANT
HOVERMATT SINGLE USE (MISCELLANEOUS) ×3 IMPLANT
KIT ABG SYR 3ML LUER SLIP (SYRINGE) IMPLANT
NEEDLE HYPO 25X5/8 SAFETYGLIDE (NEEDLE) IMPLANT
NS IRRIG 1000ML POUR BTL (IV SOLUTION) ×3 IMPLANT
PACK C SECTION WH (CUSTOM PROCEDURE TRAY) ×3 IMPLANT
PAD ABD 7.5X8 STRL (GAUZE/BANDAGES/DRESSINGS) ×3 IMPLANT
PAD OB MATERNITY 4.3X12.25 (PERSONAL CARE ITEMS) ×3 IMPLANT
PENCIL SMOKE EVAC W/HOLSTER (ELECTROSURGICAL) ×3 IMPLANT
RETRACTOR TRAXI PANNICULUS (MISCELLANEOUS) ×1 IMPLANT
RTRCTR C-SECT PINK 25CM LRG (MISCELLANEOUS) ×3 IMPLANT
SPONGE LAP 18X18 RF (DISPOSABLE) ×3 IMPLANT
STRIP CLOSURE SKIN 1/2X4 (GAUZE/BANDAGES/DRESSINGS) ×2 IMPLANT
SUT MNCRL 0 VIOLET CTX 36 (SUTURE) ×3 IMPLANT
SUT MONOCRYL 0 CTX 36 (SUTURE) ×6
SUT PLAIN 2 0 XLH (SUTURE) IMPLANT
SUT VIC AB 0 CT1 27 (SUTURE) ×4
SUT VIC AB 0 CT1 27XBRD ANBCTR (SUTURE) ×2 IMPLANT
SUT VIC AB 2-0 CT1 27 (SUTURE) ×2
SUT VIC AB 2-0 CT1 TAPERPNT 27 (SUTURE) ×1 IMPLANT
SUT VIC AB 4-0 KS 27 (SUTURE) ×6 IMPLANT
TAPE CLOTH SURG 4X10 WHT LF (GAUZE/BANDAGES/DRESSINGS) ×3 IMPLANT
TOWEL OR 17X24 6PK STRL BLUE (TOWEL DISPOSABLE) ×3 IMPLANT
TRAXI PANNICULUS RETRACTOR (MISCELLANEOUS) ×2
TRAY FOLEY W/BAG SLVR 14FR LF (SET/KITS/TRAYS/PACK) ×3 IMPLANT
WATER STERILE IRR 1000ML POUR (IV SOLUTION) ×3 IMPLANT

## 2018-03-29 NOTE — Progress Notes (Signed)
Dr. Henderson Cloud states to give pt one percocet now. MD made aware of anesthesia order that says to hold CNS depressants until 12 hours post duramorph dose. She states to still give percocet dose now.

## 2018-03-29 NOTE — Progress Notes (Signed)
There has been no change in the patients history, status or exam since the history and physical.  Vitals:   03/26/18 1521 03/29/18 0555 03/29/18 0600  BP:   127/66  Pulse:  95   Resp:  20   Temp:  98.1 F (36.7 C)   TempSrc:  Oral   SpO2:   100%  Weight: 127 kg 127 kg   Height: 5\' 1"  (1.549 m) 5\' 1"  (1.549 m)     Results for orders placed or performed during the hospital encounter of 03/29/18 (from the past 72 hour(s))  RPR     Status: None   Collection Time: 03/27/18 10:22 AM  Result Value Ref Range   RPR Ser Ql Non Reactive Non Reactive    Comment: (NOTE) Performed At: [facility] [address] [place], [place] [id number] Jolene Schimke MD WU:[id number]   CBC     Status: Abnormal   Collection Time: 03/27/18 10:22 AM  Result Value Ref Range   WBC 8.0 4.0 - 10.5 K/uL   RBC 4.15 3.87 - 5.11 MIL/uL   Hemoglobin 10.5 (L) 12.0 - 15.0 g/dL   HCT 95.6 (L) 21.3 - 08.6 %   MCV 81.4 80.0 - 100.0 fL   MCH 25.3 (L) 26.0 - 34.0 pg   MCHC 31.1 30.0 - 36.0 g/dL   RDW 57.8 46.9 - 62.9 %   Platelets 229 150 - 400 K/uL   nRBC 0.0 0.0 - 0.2 %    Comment: Performed at [facility], [address] [place], [place] [address]  Type and screen     Status: None   Collection Time: 03/27/18 10:22 AM  Result Value Ref Range   ABO/RH(D) A NEG    Antibody Screen NEG    Sample Expiration 03/30/2018    Weak D      POS Performed at [facility], [address], [place], [place] [address]     Loney Laurence

## 2018-03-29 NOTE — Lactation Note (Signed)
This note was copied from a baby's chart. Lactation Consultation Note  Patient Name: Cheryl Nolan [id number] Date: 03/29/2018 Reason for consult: Initial assessment;Term  6 hours old FT female who is being exclusively BF by his mother, she's a P2 and experienced BF. She was able to BF her first child for 4 months, participated in the [facility] program at the [facility] and took BF classes there too. Mom just needed a little reminder with hand expression, LC reviewed hand expression with mom and colostrum was easily flowing off her nipples. LC rubbed in on baby's mouth and mom's nipples. Reviewed prevention and treatment for sore nipples.  Offered assistance with latch but mom politely declined, baby was asleep. Asked mom to call for assistance when needed. Per mom, feedings at the breast are comfortable and she's able to hear baby swallowing when he's at the breast. Reviewed cluster feeding, feeding cues and normal newborn behavior.  Feeding plan:  1. Encouraged mom to feed baby STS 8-12 times/24 hours or sooner if feeding cues are present 2. Hand expression and spoon feeding was also encouraged  BF brochure and feeding diary were reviewed. Mom reported all questions and concerns were answered, she's aware of LC services and will call PRN.  Maternal Data Formula Feeding for Exclusion: No Has patient been taught Hand Expression?: Yes Does the patient have breastfeeding experience prior to this delivery?: Yes  Feeding Feeding Type: Breast Fed  Interventions Interventions: Breast feeding basics reviewed;Breast massage;Hand express;Breast compression  Lactation Tools Discussed/Used WIC Program: Yes   Consult Status Consult Status: Follow-up Date: 03/30/18 Follow-up type: In-patient    Ivann Trimarco Venetia Constable 03/29/2018, 2:28 PM

## 2018-03-29 NOTE — Brief Op Note (Signed)
03/29/2018  10:54 AM  PATIENT:  Cheryl Nolan  28 y.o. female  PRE-OPERATIVE DIAGNOSIS:  REPEAT EDD: 03/29/2018  POST-OPERATIVE DIAGNOSIS:  REPEAT EDD: 03/29/2018  PROCEDURE:  Procedure(s): CESAREAN SECTION (N/A)  SURGEON:  Surgeon(s) and Role:    Carrington Clamp, MD - Primary  ANESTHESIA:   spinal  EBL:  391 mL   SPECIMEN:  No Specimen  DISPOSITION OF SPECIMEN:  N/A  COUNTS:  YES  TOURNIQUET:  * No tourniquets in log *  DICTATION: .Note written in EPIC  PLAN OF CARE: Admit to inpatient   PATIENT DISPOSITION:  PACU - hemodynamically stable.   Delay start of Pharmacological VTE agent (>24hrs) due to surgical blood loss or risk of bleeding: not applicable

## 2018-03-29 NOTE — Anesthesia Procedure Notes (Signed)
Spinal  Patient location during procedure: OR Start time: 03/29/2018 7:41 AM End time: 03/29/2018 7:44 AM Staffing Anesthesiologist: Bethena Midget, MD Preanesthetic Checklist Completed: patient identified, site marked, surgical consent, pre-op evaluation, timeout performed, IV checked, risks and benefits discussed and monitors and equipment checked Spinal Block Patient position: sitting Prep: DuraPrep Patient monitoring: heart rate, cardiac monitor, continuous pulse ox and blood pressure Approach: midline Location: L2-3 Injection technique: single-shot Needle Needle type: Sprotte  Needle gauge: 24 G Needle length: 9 cm Assessment Sensory level: T4

## 2018-03-29 NOTE — Anesthesia Preprocedure Evaluation (Signed)
Anesthesia Evaluation  Patient identified by MRN, date of birth, ID band Patient awake    Reviewed: Allergy & Precautions, H&P , NPO status , Patient's Chart, lab work & pertinent test results, reviewed documented beta blocker date and time   Airway Mallampati: II  TM Distance: >3 FB Neck ROM: full    Dental no notable dental hx.    Pulmonary neg pulmonary ROS,    Pulmonary exam normal breath sounds clear to auscultation       Cardiovascular hypertension, Normal cardiovascular exam Rhythm:regular Rate:Normal     Neuro/Psych negative neurological ROS  negative psych ROS   GI/Hepatic negative GI ROS, Neg liver ROS,   Endo/Other  Morbid obesity  Renal/GU negative Renal ROS  negative genitourinary   Musculoskeletal   Abdominal (+) + obese,   Peds  Hematology negative hematology ROS (+)   Anesthesia Other Findings   Reproductive/Obstetrics (+) Pregnancy                             Anesthesia Physical Anesthesia Plan  ASA: III and emergent  Anesthesia Plan: Spinal   Post-op Pain Management:    Induction:   PONV Risk Score and Plan: 2 and Treatment may vary due to age or medical condition and Ondansetron  Airway Management Planned: Nasal Cannula  Additional Equipment:   Intra-op Plan:   Post-operative Plan:   Informed Consent: I have reviewed the patients History and Physical, chart, labs and discussed the procedure including the risks, benefits and alternatives for the proposed anesthesia with the patient or authorized representative who has indicated his/her understanding and acceptance.       Plan Discussed with: Anesthesiologist, Surgeon and CRNA  Anesthesia Plan Comments: (  )        Anesthesia Quick Evaluation

## 2018-03-29 NOTE — Op Note (Signed)
03/29/2018  10:54 AM  PATIENT:  Cheryl Nolan  28 y.o. female  PRE-OPERATIVE DIAGNOSIS:  REPEAT EDD: 03/29/2018  POST-OPERATIVE DIAGNOSIS:  REPEAT EDD: 03/29/2018  PROCEDURE:  Procedure(s): CESAREAN SECTION (N/A)  SURGEON:  Surgeon(s) and Role:    Carrington Clamp, MD - Primary  ANESTHESIA:   spinal  EBL:  391 mL   SPECIMEN:  No Specimen  DISPOSITION OF SPECIMEN:  N/A  COUNTS:  YES  TOURNIQUET:  * No tourniquets in log *  DICTATION: .Note written in EPIC  PLAN OF CARE: Admit to inpatient   PATIENT DISPOSITION:  PACU - hemodynamically stable.   Delay start of Pharmacological VTE agent (>24hrs) due to surgical blood loss or risk of bleeding: not applicable  Complications:  none Medications:  Ancef, Pitocin Findings:  Baby female, Apgars 8,9, weight P.   Normal tubes, ovaries and uterus seen.  Baby was skin to skin with mother after birth in the OR.  Technique:  After adequate spinal anesthesia was achieved, the patient was prepped and draped in usual sterile fashion.  A foley catheter was used to drain the bladder.  A pfannanstiel incision was made with the scalpel and carried down to the fascia with the bovie cautery. The fascia was incised in the midline with the scalpel and carried in a transverse curvilinear manner bilaterally.  The fascia was reflected superiorly and inferiorly off the rectus muscles and the muscles split in the midline.  A bowel free portion of the peritoneum was entered bluntly and then extended in a superior and inferior manner with good visualization of the bowel and bladder.  The Alexis instrument was then placed and the vesico-uterine fascia tented up and incised in a transverse curvilinear manner.  A 2 cm transverse incision was made in the upper portion of the lower uterine segment until the amnion was exposed.   The incision was extended transversely in a blunt manner.  Clear fluid was noted and the baby delivered in the vertex presentation  without complication.  The baby was bulb suctioned and the cord was clamped and cut aftet stripping blood from cord into baby.  The baby was then handed to awaiting Neonatology.  The placenta was then delivered manually and the uterus cleared of all debris.  The uterine incision was then closed with a running lock stitch of 0 monocryl.  An imbricating layer of 0 monocryl was closed as well. Excellent hemostasis of the uterine incision was achieved and the abdomen was cleared with irrigation.  The peritoneum was closed with a running stitch of 2-0 vicryl.  This incorporated the rectus muscles as a separate layer.  The fascia was then closed with a running stitch of 0 vicryl.  The subcutaneous layer was closed with interrupted  stitches of 2-0 plain gut.  The skin was closed with 4-0 vicryl on a Keith needle and steri-strips.  The patient tolerated the procedure well and was returned to the recovery room in stable condition.  All counts were correct times three.  Loney Laurence

## 2018-03-29 NOTE — Addendum Note (Signed)
Addendum  created 03/29/18 1210 by Bethena Midget, MD   Order list changed, Order sets accessed

## 2018-03-29 NOTE — Transfer of Care (Signed)
Immediate Anesthesia Transfer of Care Note  Patient: Cheryl Nolan  Procedure(s) Performed: CESAREAN SECTION (N/A )  Patient Location: PACU  Anesthesia Type:Spinal  Level of Consciousness: awake and oriented  Airway & Oxygen Therapy: Patient Spontanous Breathing  Post-op Assessment: Report given to RN and Post -op Vital signs reviewed and stable  Post vital signs: Reviewed  Last Vitals:  Vitals Value Taken Time  BP    Temp    Pulse    Resp    SpO2      Last Pain:  Vitals:   03/29/18 0555  TempSrc: Oral      Patients Stated Pain Goal: 0 (03/29/18 0555)  Complications: No apparent anesthesia complications

## 2018-03-29 NOTE — Anesthesia Postprocedure Evaluation (Signed)
Anesthesia Post Note  Patient: Cheryl Nolan  Procedure(s) Performed: CESAREAN SECTION (N/A )     Patient location during evaluation: PACU Anesthesia Type: Spinal Level of consciousness: oriented and awake and alert Pain management: pain level controlled Vital Signs Assessment: post-procedure vital signs reviewed and stable Respiratory status: spontaneous breathing, respiratory function stable and patient connected to nasal cannula oxygen Cardiovascular status: blood pressure returned to baseline and stable Postop Assessment: no headache, no backache and no apparent nausea or vomiting Anesthetic complications: no    Last Vitals:  Vitals:   03/29/18 1045 03/29/18 1148  BP: 140/74 110/60  Pulse: 80 75  Resp: 18 18  Temp: 36.9 C 36.7 C  SpO2: 99% 98%    Last Pain:  Vitals:   03/29/18 1046  TempSrc:   PainSc: 7    Pain Goal: Patients Stated Pain Goal: 0 (03/29/18 1000)                 Sevag Shearn

## 2018-03-30 ENCOUNTER — Other Ambulatory Visit: Payer: Self-pay

## 2018-03-30 LAB — CBC
HCT: 30.3 % — ABNORMAL LOW (ref 36.0–46.0)
Hemoglobin: 9.2 g/dL — ABNORMAL LOW (ref 12.0–15.0)
MCH: 25.2 pg — ABNORMAL LOW (ref 26.0–34.0)
MCHC: 30.4 g/dL (ref 30.0–36.0)
MCV: 83 fL (ref 80.0–100.0)
Platelets: 204 10*3/uL (ref 150–400)
RBC: 3.65 MIL/uL — AB (ref 3.87–5.11)
RDW: 14.8 % (ref 11.5–15.5)
WBC: 11.5 10*3/uL — ABNORMAL HIGH (ref 4.0–10.5)
nRBC: 0 % (ref 0.0–0.2)

## 2018-03-30 LAB — KLEIHAUER-BETKE STAIN
# VIALS RHIG: 1
Fetal Cells %: 0 %
Quantitation Fetal Hemoglobin: 0 mL

## 2018-03-30 MED ORDER — RHO D IMMUNE GLOBULIN 1500 UNIT/2ML IJ SOSY
300.0000 ug | PREFILLED_SYRINGE | Freq: Once | INTRAMUSCULAR | Status: AC
  Administered 2018-03-30: 300 ug via INTRAVENOUS
  Filled 2018-03-30: qty 2

## 2018-03-30 NOTE — Anesthesia Postprocedure Evaluation (Signed)
Anesthesia Post Note  Patient: Cheryl Nolan  Procedure(s) Performed: CESAREAN SECTION (N/A )     Patient location during evaluation: Mother Baby Anesthesia Type: Spinal Level of consciousness: oriented and awake and alert Pain management: pain level controlled Vital Signs Assessment: post-procedure vital signs reviewed and stable Respiratory status: spontaneous breathing, respiratory function stable and patient connected to nasal cannula oxygen Cardiovascular status: blood pressure returned to baseline and stable Postop Assessment: no headache, no backache and no apparent nausea or vomiting Anesthetic complications: no    Last Vitals:  Vitals:   03/30/18 0108 03/30/18 0518  BP: 114/67 112/64  Pulse: 67 79  Resp: 16 16  Temp:  36.4 C  SpO2: 99%     Last Pain:  Vitals:   03/30/18 0518  TempSrc:   PainSc: 0-No pain   Pain Goal: Patients Stated Pain Goal: 0 (03/29/18 1000)                 Sulaiman Imbert

## 2018-03-30 NOTE — Progress Notes (Signed)
Patient is doing well.  She is tolerating PO, ambulating, voiding.  Pain is controlled.  Lochia is appropriate  Vitals:   03/29/18 1749 03/29/18 2130 03/30/18 0108 03/30/18 0518  BP: 115/62 119/74 114/67 112/64  Pulse: 70 77 67 79  Resp: _0 Temp: 97.9 F (36.6 C)   97.6 F (36.4 C)  TempSrc: Oral     SpO2: 100% 99% 99%   Weight:      Height:        NAD Abdomen:  soft, appropriate tenderness, pressure bandage in place ext:    Symmetric, trace edema bilaterally  Lab Results  Component Value Date   WBC 11.5 (H) 03/30/2018   HGB 9.2 (L) 03/30/2018   HCT 30.3 (L) 03/30/2018   MCV 83.0 03/30/2018   PLT 204 03/30/2018    --/--/A NEG Performed at [facility] [address] [place], [place] [address]  (03/09 0442)/RNI  A/P    28 y.o. G3P2012 POD 1 s/p RCS Routine post op and postpartum care.   RNI--MMR prior to d/c Remove pressure bandage today after shower A neg--baby Rh+, rhogam pending

## 2018-03-30 NOTE — Lactation Note (Signed)
This note was copied from a baby's chart. Lactation Consultation Note  Patient Name: Cheryl Nolan [id number] Date: 03/30/2018 Reason for consult: Follow-up assessment Baby is 28 hours old/3% weight loss.  Mom states baby is latching well but prefers one side.  Baby can latch to both breasts.  Reassured mom this is normal.  No other questions or concerns.  Encouraged to call for assist prn.  Maternal Data    Feeding    LATCH Score Latch: Repeated attempts needed to sustain latch, nipple held in mouth throughout feeding, stimulation needed to elicit sucking reflex.  Audible Swallowing: Spontaneous and intermittent  Type of Nipple: Flat  Comfort (Breast/Nipple): Soft / non-tender  Hold (Positioning): No assistance needed to correctly position infant at breast.  LATCH Score: 8  Interventions    Lactation Tools Discussed/Used     Consult Status Consult Status: Follow-up Date: 03/31/18 Follow-up type: In-patient    Huston Foley 03/30/2018, 12:09 PM

## 2018-03-30 NOTE — Addendum Note (Signed)
Addendum  created 03/30/18 0724 by Orlie Pollen, CRNA   Clinical Note Signed

## 2018-03-31 LAB — RH IG WORKUP (INCLUDES ABO/RH)
ABO/RH(D): A NEG
Gestational Age(Wks): 40
Unit division: 0

## 2018-03-31 MED ORDER — OXYCODONE-ACETAMINOPHEN 5-325 MG PO TABS: 1.0000 | ORAL_TABLET | ORAL | 0 refills | Status: DC | PRN

## 2018-03-31 NOTE — Lactation Note (Signed)
This note was copied from a baby's chart. Lactation Consultation Note  Patient Name: Cheryl Nolan Date: 03/31/2018 Reason for consult: Follow-up assessment;Term Baby is 50 hours old/6% weight loss.  Mom states baby can latch to both breasts but right side very sore.  She requests a DEBP so she can rest nipples.  Right nipple has a small crack at base.  Breasts are full but not engorged.  Symphony pump set up and initiated on standard setting.  Good flow of milk from both breasts.  Mom plans on bottle feeding expressed milk.  Instructed to call for [facility] assist when she is ready to put baby to breast.  Maternal Data    Feeding    LATCH Score                   Interventions    Lactation Tools Discussed/Used Pump Review: Setup, frequency, and cleaning;Milk Storage Initiated by:: Lmoulden Date initiated:: 03/31/18   Consult Status Consult Status: Follow-up Date: 04/01/18 Follow-up type: In-patient    Huston Foley 03/31/2018, 10:40 AM

## 2018-03-31 NOTE — Discharge Summary (Signed)
Obstetric Discharge Summary Reason for Admission: cesarean section Prenatal Procedures: ultrasound Intrapartum Procedures: cesarean: low cervical, transverse Postpartum Procedures: none Complications-Operative and Postpartum: none Hemoglobin  Date Value Ref Range Status  03/30/2018 9.2 (L) 12.0 - 15.0 g/dL Final   HCT  Date Value Ref Range Status  03/30/2018 30.3 (L) 36.0 - 46.0 % Final    Physical Exam:  General: alert and cooperative Lochia: appropriate Uterine Fundus: firm Incision: healing well, old drainage on dressing, advised staff to change prior to discharge DVT Evaluation: No evidence of DVT seen on physical exam.  Discharge Diagnoses: Term Pregnancy-delivered  Discharge Information: Date: 03/31/2018 Activity: pelvic rest Diet: routine Medications: PNV and Ibuprofen, percocet Condition: improved Instructions: refer to practice specific booklet Discharge to: home Follow-up Information    Carrington Clamp, MD Follow up in 4 week(s).   Specialty:  Obstetrics and Gynecology Contact information: [address] [place] [address] [phone]           Newborn Data: Live born female  Birth Weight: 8 lb 6.6 oz (3816 g) APGAR: 8, 9  Newborn Delivery   Birth date/time:  03/29/2018 08:08:00 Delivery type:  C-Section, Low Transverse Trial of labor:  No C-section categorization:  Repeat     Home with mother.  Philip Aspen 03/31/2018, 10:05 AM

## 2018-04-02 ENCOUNTER — Ambulatory Visit: Payer: Self-pay

## 2018-04-02 NOTE — Lactation Note (Signed)
This note was copied from a baby's chart. Lactation Consultation Note  Patient Name: Boy Noya Griffus HERDE'Y Date: 04/02/2018 Reason for consult: Follow-up assessment;Hyperbilirubinemia;Term  P2 mother whose infant is now 40 days old.    Mother had no questions/concerns related to breast feeding.  She is currently supplementing with formula.  Mother's breasts are soft and non tender and nipples are intact.  She has a manual pump and a DEBP for home use.  Engorgement prevention/treatment discussed.  Father present.  Mother has our OP phone number for questions/concerns after discharge.   Maternal Data Formula Feeding for Exclusion: No Has patient been taught Hand Expression?: Yes Does the patient have breastfeeding experience prior to this delivery?: Yes  Feeding Feeding Type: Breast Milk  LATCH Score                   Interventions    Lactation Tools Discussed/Used WIC Program: Yes   Consult Status Consult Status: Complete Date: 04/02/18 Follow-up type: Call as needed    Shoshana Johal R Emmagrace Runkel 04/02/2018, 8:18 AM

## 2018-04-05 ENCOUNTER — Emergency Department (HOSPITAL_COMMUNITY): Payer: Medicaid Other

## 2018-04-05 ENCOUNTER — Encounter (HOSPITAL_COMMUNITY): Payer: Self-pay

## 2018-04-05 ENCOUNTER — Emergency Department (HOSPITAL_COMMUNITY)
Admission: EM | Admit: 2018-04-05 | Discharge: 2018-04-05 | Disposition: A | Discharge status: Home / Self Care | Payer: Medicaid Other | Attending: Emergency Medicine | Admitting: Emergency Medicine

## 2018-04-05 ENCOUNTER — Other Ambulatory Visit: Payer: Self-pay

## 2018-04-05 DIAGNOSIS — I10 Essential (primary) hypertension: Secondary | ICD-10-CM | POA: Insufficient documentation

## 2018-04-05 DIAGNOSIS — R509 Fever, unspecified: Secondary | ICD-10-CM | POA: Diagnosis present

## 2018-04-05 DIAGNOSIS — N10 Acute tubulo-interstitial nephritis: Secondary | ICD-10-CM | POA: Diagnosis not present

## 2018-04-05 DIAGNOSIS — D72829 Elevated white blood cell count, unspecified: Secondary | ICD-10-CM | POA: Diagnosis not present

## 2018-04-05 DIAGNOSIS — N12 Tubulo-interstitial nephritis, not specified as acute or chronic: Secondary | ICD-10-CM

## 2018-04-05 LAB — I-STAT BETA HCG BLOOD, ED (MC, WL, AP ONLY): I-stat hCG, quantitative: 42.7 m[IU]/mL — ABNORMAL HIGH (ref <5)

## 2018-04-05 LAB — CBC WITH DIFFERENTIAL/PLATELET
Abs Immature Granulocytes: 0.06 10*3/uL (ref 0.00–0.07)
Basophils Absolute: 0 10*3/uL (ref 0.0–0.1)
Basophils Relative: 0 %
Eosinophils Absolute: 0.1 10*3/uL (ref 0.0–0.5)
Eosinophils Relative: 0 %
HCT: 33.6 % — ABNORMAL LOW (ref 36.0–46.0)
Hemoglobin: 10.1 g/dL — ABNORMAL LOW (ref 12.0–15.0)
Immature Granulocytes: 1 %
Lymphocytes Relative: 9 %
Lymphs Abs: 1.1 10*3/uL (ref 0.7–4.0)
MCH: 25.2 pg — ABNORMAL LOW (ref 26.0–34.0)
MCHC: 30.1 g/dL (ref 30.0–36.0)
MCV: 83.8 fL (ref 80.0–100.0)
Monocytes Absolute: 0.7 10*3/uL (ref 0.1–1.0)
Monocytes Relative: 6 %
Neutro Abs: 10.6 10*3/uL — ABNORMAL HIGH (ref 1.7–7.7)
Neutrophils Relative %: 84 %
Platelets: 335 10*3/uL (ref 150–400)
RBC: 4.01 MIL/uL (ref 3.87–5.11)
RDW: 14.4 % (ref 11.5–15.5)
WBC: 12.5 10*3/uL — AB (ref 4.0–10.5)
nRBC: 0 % (ref 0.0–0.2)

## 2018-04-05 LAB — COMPREHENSIVE METABOLIC PANEL
ALK PHOS: 76 U/L (ref 38–126)
ALT: 16 U/L (ref 0–44)
AST: 14 U/L — ABNORMAL LOW (ref 15–41)
Albumin: 3 g/dL — ABNORMAL LOW (ref 3.5–5.0)
Anion gap: 9 (ref 5–15)
BUN: 5 mg/dL — ABNORMAL LOW (ref 6–20)
CALCIUM: 8.7 mg/dL — AB (ref 8.9–10.3)
CO2: 21 mmol/L — ABNORMAL LOW (ref 22–32)
Chloride: 107 mmol/L (ref 98–111)
Creatinine, Ser: 0.91 mg/dL (ref 0.44–1.00)
GFR calc Af Amer: >60 mL/min (ref >60)
Glucose, Bld: 78 mg/dL (ref 70–99)
Potassium: 3.6 mmol/L (ref 3.5–5.1)
Sodium: 137 mmol/L (ref 135–145)
Total Bilirubin: 0.7 mg/dL (ref 0.3–1.2)
Total Protein: 6.5 g/dL (ref 6.5–8.1)

## 2018-04-05 LAB — URINALYSIS, ROUTINE W REFLEX MICROSCOPIC
Bilirubin Urine: NEGATIVE
Glucose, UA: NEGATIVE mg/dL
Ketones, ur: 20 mg/dL — AB
Nitrite: NEGATIVE
PROTEIN: NEGATIVE mg/dL
RBC / HPF: >50 RBC/hpf — ABNORMAL HIGH (ref 0–5)
Specific Gravity, Urine: 1.011 (ref 1.005–1.030)
WBC, UA: >50 WBC/hpf — ABNORMAL HIGH (ref 0–5)
pH: 6 (ref 5.0–8.0)

## 2018-04-05 LAB — INFLUENZA PANEL BY PCR (TYPE A & B)
Influenza A By PCR: NEGATIVE
Influenza B By PCR: NEGATIVE

## 2018-04-05 LAB — I-STAT TROPONIN, ED: Troponin i, poc: 0.01 ng/mL (ref 0.00–0.08)

## 2018-04-05 LAB — I-STAT CREATININE, ED: Creatinine, Ser: 0.8 mg/dL (ref 0.44–1.00)

## 2018-04-05 MED ORDER — CEPHALEXIN 500 MG PO CAPS: 500.0000 mg | ORAL_CAPSULE | Freq: Two times a day (BID) | ORAL | 0 refills | Status: DC

## 2018-04-05 MED ORDER — IOHEXOL 350 MG/ML SOLN
100.0000 mL | Freq: Once | INTRAVENOUS | Status: AC | PRN
  Administered 2018-04-05: 100 mL via INTRAVENOUS

## 2018-04-05 MED ORDER — CEPHALEXIN 500 MG PO CAPS: 500.0000 mg | ORAL_CAPSULE | Freq: Two times a day (BID) | ORAL | 0 refills | Status: AC

## 2018-04-05 MED ORDER — SODIUM CHLORIDE 0.9 % IV SOLN
2.0000 g | Freq: Once | INTRAVENOUS | Status: AC
  Administered 2018-04-05: 2 g via INTRAVENOUS
  Filled 2018-04-05: qty 20

## 2018-04-05 MED ORDER — ACETAMINOPHEN 325 MG PO TABS
650.0000 mg | ORAL_TABLET | Freq: Once | ORAL | Status: AC
  Administered 2018-04-05: 650 mg via ORAL
  Filled 2018-04-05: qty 2

## 2018-04-05 MED ORDER — SODIUM CHLORIDE 0.9 % IV BOLUS
1000.0000 mL | Freq: Once | INTRAVENOUS | Status: AC
  Administered 2018-04-05: 1000 mL via INTRAVENOUS

## 2018-04-05 MED ORDER — HYDROCODONE-ACETAMINOPHEN 5-325 MG PO TABS
1.0000 | ORAL_TABLET | Freq: Once | ORAL | Status: AC
  Administered 2018-04-05: 1 via ORAL
  Filled 2018-04-05: qty 1

## 2018-04-05 MED ORDER — SODIUM CHLORIDE 0.9 % IV SOLN
INTRAVENOUS | Status: DC | PRN
  Administered 2018-04-05: 40 mL via INTRAVENOUS

## 2018-04-05 NOTE — ED Triage Notes (Signed)
Pt arrives with [facility] from home c/o panic attack after "having argument with baby's father" and is 1 week post-partum (C-Section). Pt has hx of preeclampsia during pregnancy and is taking percocet for post-op pain. Pt has 101 temp per EMS. Temp checked upon arrival, fever of 100.6.   EMS vitals: 137/84 HR 88 RR 16-18 100% O2 on RA

## 2018-04-05 NOTE — ED Notes (Signed)
ED Provider at bedside. 

## 2018-04-05 NOTE — ED Provider Notes (Signed)
[facility] EMERGENCY DEPARTMENT Provider Note   CSN: [id number] Arrival date & time: 04/05/18  1848    History   Chief Complaint Chief Complaint  Patient presents with   Fever   Panic Attack    HPI Cheryl Nolan is a 28 y.o. female.     HPI   Patient is a 27-year female the history of hypertension, recent C-section on 03-29-2018 per Dr. Henderson Cloud of [facility] presenting for fever, chills, chest pain and back pain.  Patient reports that she was overall in her usual state of health this morning, but approximate hour prior to arrival she began breathing heavily.  Her sister became concerned and called 911.  Patient reports that her feeling of anxiousness and shortness of breath was in the setting of an argument with her partner.  Patient reports that she does have some pleuritic chest pain in the center of her chest.  She denies any breast enlargement, tenderness, or erythema.  She reports some pain with movement of her incision site, but denies any foul-smelling drainage, erythema, swelling, or significant pain without movement of the lower abdomen.  She denies any nausea, vomiting, diarrhea.  She denies any cough, hemoptysis, headache, sore throat, rhinorrhea, congestion.  She does report that she has some bilateral mid back pain.  She does report some pain with urination.  Past Medical History:  Diagnosis Date   Hypertension     Patient Active Problem List   Diagnosis Date Noted   Postoperative state 03/29/2018    Past Surgical History:  Procedure Laterality Date   CESAREAN SECTION     CESAREAN SECTION N/A 03/29/2018   Procedure: CESAREAN SECTION;  Surgeon: Carrington Clamp, MD;  Location: [facility] LD ORS;  Service: Obstetrics;  Laterality: N/A;     OB History    Gravida  3   Para  2   Term  2   Preterm      AB  1   Living  2     SAB      TAB  1   Ectopic      Multiple  0   Live Births  1            Home Medications     Prior to Admission medications   Medication Sig Start Date End Date Taking? Authorizing Provider  oxyCODONE-acetaminophen (PERCOCET/ROXICET) 5-325 MG tablet Take 1-2 tablets by mouth every 4 (four) hours as needed for moderate pain. 03/31/18   Philip Aspen, DO    Family History History reviewed. No pertinent family history.  Social History Social History   Tobacco Use   Smoking status: Never Smoker   Smokeless tobacco: Never Used  Substance Use Topics   Alcohol use: Not Currently   Drug use: Never     Allergies   Patient has no known allergies.   Review of Systems Review of Systems  Constitutional: Positive for chills and fever.  HENT: Negative for congestion and sore throat.   Eyes: Negative for visual disturbance.  Respiratory: Positive for shortness of breath. Negative for cough and chest tightness.   Cardiovascular: Positive for chest pain. Negative for palpitations and leg swelling.  Gastrointestinal: Negative for abdominal pain, nausea and vomiting.  Genitourinary: Positive for flank pain. Negative for dysuria.  Musculoskeletal: Positive for back pain. Negative for myalgias.  Skin: Negative for rash.  Neurological: Negative for dizziness, syncope, light-headedness and headaches.  Psychiatric/Behavioral: The patient is nervous/anxious.      Physical Exam Updated  Vital Signs BP (!) 136/98    Pulse 89    Temp 99.5 F (37.5 C) (Oral)    Resp (!) 29    Ht  (1.549 m)    Wt 127 kg    SpO2 99%    BMI 52.90 kg/m   Physical Exam Vitals signs and nursing note reviewed.  Constitutional:      General: She is not in acute distress.    Appearance: She is well-developed. She is diaphoretic.     Comments: Nontoxic-appearing, but shivering on exam.  HENT:     Head: Normocephalic and atraumatic.  Eyes:     General:        Right eye: No discharge.        Left eye: No discharge.     Extraocular Movements: Extraocular movements intact.      Conjunctiva/sclera: Conjunctivae normal.     Pupils: Pupils are equal, round, and reactive to light.  Neck:     Musculoskeletal: Normal range of motion and neck supple.  Cardiovascular:     Rate and Rhythm: Normal rate and regular rhythm.     Heart sounds: S1 normal and S2 normal. No murmur.  Pulmonary:     Effort: Pulmonary effort is normal.     Breath sounds: Normal breath sounds. No wheezing or rales.  Abdominal:     General: There is no distension.     Palpations: Abdomen is soft.     Tenderness: There is abdominal tenderness. There is no guarding.     Comments: Patient's surgical scar from low transverse C-section appears well-healing.  There is no drainage.  No erythema.  Patient has some mild tenderness surrounding as well as tenderness to the pannus, but no guarding or rebound on her abdominal exam.  Musculoskeletal: Normal range of motion.        General: Tenderness present. No deformity.     Comments: Patient has mild tenderness to palpation of soft tissue of the back bilaterally.  No midline tenderness.  Lymphadenopathy:     Cervical: No cervical adenopathy.  Skin:    General: Skin is warm.     Findings: No erythema or rash.     Comments: Bilateral breast palpated with RN chaperone present.  No evidence of fluctuance or mastitis.  Neurological:     Mental Status: She is alert.     Comments: Cranial nerves grossly intact. Patient moves extremities symmetrically and with good coordination.  Psychiatric:        Behavior: Behavior normal.        Thought Content: Thought content normal.        Judgment: Judgment normal.      ED Treatments / Results  Labs (all labs ordered are listed, but only abnormal results are displayed) Labs Reviewed  COMPREHENSIVE METABOLIC PANEL - Abnormal; Notable for the following components:      Result Value   CO2 21 (*)    BUN 5 (*)    Calcium 8.7 (*)    Albumin 3.0 (*)    AST 14 (*)    All other components within normal limits  CBC  WITH DIFFERENTIAL/PLATELET - Abnormal; Notable for the following components:   WBC 12.5 (*)    Hemoglobin 10.1 (*)    HCT 33.6 (*)    MCH 25.2 (*)    Neutro Abs 10.6 (*)    All other components within normal limits  URINALYSIS, ROUTINE W REFLEX MICROSCOPIC - Abnormal; Notable for the following components:  APPearance HAZY (*)    Hgb urine dipstick LARGE (*)    Ketones, ur 20 (*)    Leukocytes,Ua LARGE (*)    RBC / HPF >50 (*)    WBC, UA >50 (*)    Bacteria, UA RARE (*)    All other components within normal limits  I-STAT BETA HCG BLOOD, ED ([facility], [facility], [facility] ONLY) - Abnormal; Notable for the following components:   I-stat hCG, quantitative 42.7 (*)    All other components within normal limits  CULTURE, BLOOD (ROUTINE X 2)  CULTURE, BLOOD (ROUTINE X 2)  INFLUENZA PANEL BY PCR (TYPE A & B)  I-STAT TROPONIN, ED  I-STAT CREATININE, ED    EKG None  Radiology Ct Angio Chest Pe W/cm &/or Wo Cm  Result Date: 04/05/2018 CLINICAL DATA:  Patient is one week post-partum from C-Section delivery. Came to ED today with complaint of supra-sternal chest pain that began today and fever (100.3). No h/o embolism. No SOB. EXAM: CT ANGIOGRAPHY CHEST CT ABDOMEN AND PELVIS WITH CONTRAST TECHNIQUE: Multidetector CT imaging of the chest was performed using the standard protocol during bolus administration of intravenous contrast. Multiplanar CT image reconstructions and MIPs were obtained to evaluate the vascular anatomy. Multidetector CT imaging of the abdomen and pelvis was performed using the standard protocol during bolus administration of intravenous contrast. CONTRAST:  OMNIPAQUE IOHEXOL 350 MG/ML SOLN COMPARISON:  None. FINDINGS: CTA CHEST FINDINGS Cardiovascular: There is satisfactory opacification of the central pulmonary arteries. Some of the segmental and the subsegmental vessels are suboptimally opacified. Allowing for this mild limitation, there is no evidence of a pulmonary embolism. Heart is  normal size. No pericardial effusion. Great vessels are within normal limits. Mediastinum/Nodes: No enlarged mediastinal, hilar, or axillary lymph nodes. Thyroid gland, trachea, and esophagus demonstrate no significant findings. Lungs/Pleura: Lungs are clear. No pleural effusion or pneumothorax. Musculoskeletal: No chest wall abnormality. No acute or significant osseous findings. Review of the MIP images confirms the above findings. CT ABDOMEN and PELVIS FINDINGS Hepatobiliary: Liver normal in size and attenuation. No mass or focal lesion. Single small gallstone. Gallbladder otherwise unremarkable. No bile duct dilation. Pancreas: Unremarkable. No pancreatic ductal dilatation or surrounding inflammatory changes. Spleen: Normal in size without focal abnormality. Adrenals/Urinary Tract: No adrenal masses. Vague hypoattenuation noted in lower pole the right kidney and upper pole the left kidney. Mild prominence of the intrarenal collecting systems, right greater than left. No renal masses or stones. No perinephric fluid collections or inflammation. Proximal to mid right ureter mildly distended. No ureteral stones. Bladder is unremarkable. Stomach/Bowel: Stomach is within normal limits. Appendix appears normal. No evidence of bowel wall thickening, distention, or inflammatory changes. Vascular/Lymphatic: No significant vascular findings are present. No enlarged abdominal or pelvic lymph nodes. Reproductive: Enlarged postpartum uterus. Stranding in the low pelvic fat consistent with post C-section edema. No abscess. No endometrial mass or air. No adnexal masses. Other: Low anterior abdominal wall skin thickening with underlying hazy and reticular opacities consistent with the recent C-section. No abdominal wall fluid collection to suggest an abscess. No ascites. Musculoskeletal: No acute or significant osseous findings. Review of the MIP images confirms the above findings. IMPRESSION: CTA CHEST 1. Negative exam. No  evidence of a pulmonary embolism. Study slightly limited by less than optimal opacification smaller segmental and subsegmental pulmonary arteries. CT ABDOMEN AND PELVIS 1. Small areas of relative hypoattenuation in each kidney, lower pole on the right and upper pole the left. Findings consistent with bilateral pyelonephritis in proper clinical  setting. No evidence of a renal or perirenal abscess. 2. Expected findings from the recent C-section delivery. No evidence of a procedure complication. 3. Small gallstone.  No acute cholecystitis. Electronically Signed   By: Amie Portland M.D.   On: 04/05/2018 22:03   Ct Abdomen Pelvis W Contrast  Result Date: 04/05/2018 CLINICAL DATA:  Patient is one week post-partum from C-Section delivery. Came to ED today with complaint of supra-sternal chest pain that began today and fever (100.3). No h/o embolism. No SOB. EXAM: CT ANGIOGRAPHY CHEST CT ABDOMEN AND PELVIS WITH CONTRAST TECHNIQUE: Multidetector CT imaging of the chest was performed using the standard protocol during bolus administration of intravenous contrast. Multiplanar CT image reconstructions and MIPs were obtained to evaluate the vascular anatomy. Multidetector CT imaging of the abdomen and pelvis was performed using the standard protocol during bolus administration of intravenous contrast. CONTRAST:  OMNIPAQUE IOHEXOL 350 MG/ML SOLN COMPARISON:  None. FINDINGS: CTA CHEST FINDINGS Cardiovascular: There is satisfactory opacification of the central pulmonary arteries. Some of the segmental and the subsegmental vessels are suboptimally opacified. Allowing for this mild limitation, there is no evidence of a pulmonary embolism. Heart is normal size. No pericardial effusion. Great vessels are within normal limits. Mediastinum/Nodes: No enlarged mediastinal, hilar, or axillary lymph nodes. Thyroid gland, trachea, and esophagus demonstrate no significant findings. Lungs/Pleura: Lungs are clear. No pleural effusion  or pneumothorax. Musculoskeletal: No chest wall abnormality. No acute or significant osseous findings. Review of the MIP images confirms the above findings. CT ABDOMEN and PELVIS FINDINGS Hepatobiliary: Liver normal in size and attenuation. No mass or focal lesion. Single small gallstone. Gallbladder otherwise unremarkable. No bile duct dilation. Pancreas: Unremarkable. No pancreatic ductal dilatation or surrounding inflammatory changes. Spleen: Normal in size without focal abnormality. Adrenals/Urinary Tract: No adrenal masses. Vague hypoattenuation noted in lower pole the right kidney and upper pole the left kidney. Mild prominence of the intrarenal collecting systems, right greater than left. No renal masses or stones. No perinephric fluid collections or inflammation. Proximal to mid right ureter mildly distended. No ureteral stones. Bladder is unremarkable. Stomach/Bowel: Stomach is within normal limits. Appendix appears normal. No evidence of bowel wall thickening, distention, or inflammatory changes. Vascular/Lymphatic: No significant vascular findings are present. No enlarged abdominal or pelvic lymph nodes. Reproductive: Enlarged postpartum uterus. Stranding in the low pelvic fat consistent with post C-section edema. No abscess. No endometrial mass or air. No adnexal masses. Other: Low anterior abdominal wall skin thickening with underlying hazy and reticular opacities consistent with the recent C-section. No abdominal wall fluid collection to suggest an abscess. No ascites. Musculoskeletal: No acute or significant osseous findings. Review of the MIP images confirms the above findings. IMPRESSION: CTA CHEST 1. Negative exam. No evidence of a pulmonary embolism. Study slightly limited by less than optimal opacification smaller segmental and subsegmental pulmonary arteries. CT ABDOMEN AND PELVIS 1. Small areas of relative hypoattenuation in each kidney, lower pole on the right and upper pole the left. Findings  consistent with bilateral pyelonephritis in proper clinical setting. No evidence of a renal or perirenal abscess. 2. Expected findings from the recent C-section delivery. No evidence of a procedure complication. 3. Small gallstone.  No acute cholecystitis. Electronically Signed   By: Amie Portland M.D.   On: 04/05/2018 22:03    Procedures Procedures (including critical care time)  Medications Ordered in ED Medications  cefTRIAXone (ROCEPHIN) 2 g in sodium chloride 0.9 % 100 mL IVPB (2 g Intravenous New Bag/Given 04/05/18 2151)  0.9 %  sodium chloride infusion (40 mLs Intravenous New Bag/Given 04/05/18 2150)  HYDROcodone-acetaminophen (NORCO/VICODIN) 5-325 MG per tablet 1 tablet (1 tablet Oral Given 04/05/18 2029)  acetaminophen (TYLENOL) tablet 650 mg (650 mg Oral Given 04/05/18 2029)  sodium chloride 0.9 % bolus 1,000 mL (1,000 mLs Intravenous New Bag/Given 04/05/18 2030)  iohexol (OMNIPAQUE) 350 MG/ML injection 100 mL (100 mLs Intravenous Contrast Given 04/05/18 2126)     Initial Impression / Assessment and Plan / ED Course  I have reviewed the triage vital signs and the nursing notes.  Pertinent labs & imaging results that were available during my care of the patient were reviewed by me and considered in my medical decision making (see chart for details).  Clinical Course as of Apr 04 2225  Wynelle Link Apr 05, 2018  2137 WBC(!): 12.5 [AM]  2139 Albumin(!): 3.0 [AM]  2139 Platelets normal.   Platelets: 335 [AM]  2139 AST(!): 14 [AM]  2139 Liver enzymes WNL.  ALT: 16 [AM]  2139 Stable and improving.    [AM]  2159 Do not suspect post partum cardiomyopathy/myocarditis.   Troponin i, poc: 0.01 [AM]  2226 Suggestive of bilateral pyelonephritis in setting of fever, back pain, and feeling unwell.   CT Angio Chest PE W/Cm &/Or Wo Cm [AM]    Clinical Course User Index [AM] Elisha Ponder, PA-C       Patient nontoxic-appearing, hemodynamically stable, however febrile to 103.  Differential  diagnosis includes pulmonary embolism, pneumonia, urinary tract infection, influenza, postpartum cardiomyopathy.  Given patient's significant fever of 103, will obtain blood cultures, lab work, troponin, EKG, CT chest, abdomen, and pelvis assessing for pulmonary embolism or other acute intrathoracic or intra-abdominal complications of her recent pelvic surgery.  Work-up demonstrating leukocytosis of 12.5.  Hemoglobin is improving from recent admission for low transverse C-section at 10.1 today.  CMP with slightly low albumin, but no electrolyte abnormalities.  Troponin is negative and EKG normal sinus rhythm without evidence of acute ischemia, infarction, or arrhythmia, therefore doubt postpartum cardiomyopathy as cause of chest pain.  Urinalysis demonstrates large amount of hemoglobin, likely consistent with patient's postpartum vaginal bleeding.  She has large leukocyte esterase and greater than 50 WBCs.  CT chest demonstrates no evidence of pulmonary embolism.  No evidence of pneumonia.  CT abdomen and pelvis shows postsurgical changes consistent with low transverse C-section without acute surgical abscess or postoperative fluid collection.  She does have findings of fluid in kidneys consistent with bilateral pyelonephritis.  Likely the source of fever, back pain.   Care signed out to Dr. Alvira Monday for reassessment and disposition.   Final Clinical Impressions(s) / ED Diagnoses   Final diagnoses:  Pyelonephritis  Leukocytosis, unspecified type    ED Discharge Orders    None       Delia Chimes 04/05/18 2300    Alvira Monday, MD 04/06/18 2338

## 2018-04-10 LAB — CULTURE, BLOOD (ROUTINE X 2)
Culture: NO GROWTH
Culture: NO GROWTH

## 2019-06-22 ENCOUNTER — Emergency Department (HOSPITAL_COMMUNITY)
Admission: EM | Admit: 2019-06-22 | Discharge: 2019-06-22 | Disposition: A | Discharge status: Home / Self Care | Payer: Medicaid Other | Attending: Emergency Medicine | Admitting: Emergency Medicine

## 2019-06-22 ENCOUNTER — Emergency Department (HOSPITAL_COMMUNITY): Payer: Medicaid Other

## 2019-06-22 ENCOUNTER — Other Ambulatory Visit: Payer: Self-pay

## 2019-06-22 ENCOUNTER — Encounter (HOSPITAL_COMMUNITY): Payer: Self-pay

## 2019-06-22 DIAGNOSIS — X509XXA Other and unspecified overexertion or strenuous movements or postures, initial encounter: Secondary | ICD-10-CM | POA: Insufficient documentation

## 2019-06-22 DIAGNOSIS — S99912A Unspecified injury of left ankle, initial encounter: Secondary | ICD-10-CM | POA: Diagnosis present

## 2019-06-22 DIAGNOSIS — Y929 Unspecified place or not applicable: Secondary | ICD-10-CM | POA: Diagnosis not present

## 2019-06-22 DIAGNOSIS — S93402A Sprain of unspecified ligament of left ankle, initial encounter: Secondary | ICD-10-CM | POA: Insufficient documentation

## 2019-06-22 DIAGNOSIS — Y939 Activity, unspecified: Secondary | ICD-10-CM | POA: Insufficient documentation

## 2019-06-22 DIAGNOSIS — I10 Essential (primary) hypertension: Secondary | ICD-10-CM | POA: Diagnosis not present

## 2019-06-22 DIAGNOSIS — S82892A Other fracture of left lower leg, initial encounter for closed fracture: Secondary | ICD-10-CM | POA: Diagnosis not present

## 2019-06-22 DIAGNOSIS — Y999 Unspecified external cause status: Secondary | ICD-10-CM | POA: Insufficient documentation

## 2019-06-22 MED ORDER — MELOXICAM 7.5 MG PO TABS
7.5000 mg | ORAL_TABLET | Freq: Once | ORAL | Status: AC
  Administered 2019-06-22: 7.5 mg via ORAL
  Filled 2019-06-22: qty 1

## 2019-06-22 MED ORDER — BACITRACIN ZINC 500 UNIT/GM EX OINT
TOPICAL_OINTMENT | CUTANEOUS | Status: AC
  Administered 2019-06-22: 1
  Filled 2019-06-22: qty 0.9

## 2019-06-22 NOTE — Progress Notes (Signed)
Orthopedic Tech Progress Note Patient Details:  Cheryl Nolan 1991-01-03 [id number] Practiced walking around the room with crutches.  Ortho Devices Type of Ortho Device: CAM walker, Crutches Ortho Device/Splint Location: LLE Ortho Device/Splint Interventions: Ordered, Application, Adjustment   Post Interventions Patient Tolerated: Well Instructions Provided: Care of device, Poper ambulation with device, Adjustment of device  Patient ID: Cheryl Nolan, female   DOB: 1990/05/09, 29 y.o.   MRN: [id number]   Ancil Linsey 06/22/2019, 8:35 AM

## 2019-06-22 NOTE — ED Notes (Signed)
Ortho called for crutches and cam walker.

## 2019-06-22 NOTE — Discharge Instructions (Addendum)
Please read instructions below. Avoid weight bearing for at least one week, preferable until follow up with orthopedics in 2 weeks. Elevate your ankle as much as possible. Apply ice for 20 minutes at a time. You can take 600mg  of ibuprofen every 6 hours as needed for pain. Call the orthopedic specialist office the next business day to schedule an appointment for repeat xray and follow up on your injury. Return to the ED for concerning symptoms.

## 2019-06-22 NOTE — ED Triage Notes (Signed)
Pt BIB [facility]. She stepped off of the curb and rolled her L ankle. Pain in L ankle and foot. Swelling noted to K ankle as well. No head injury. A&Ox4.

## 2019-06-22 NOTE — ED Provider Notes (Signed)
[facility]-EMERGENCY DEPT Provider Note   CSN: [id number] Arrival date & time: 06/22/19  3086     History Chief Complaint  Patient presents with  . Ankle Pain    Cheryl Nolan is a 29 y.o. female w PMHx HTN, presenting to the ED with complaint of sudden onset of left ankle pain that began prior to arrival. Ptreports she rolled her ankle when stepping off the curb this morning. She internally rotated her ankle and has pain and swelling to the lateral aspect. Pain is worse with weight bearing and movement. She also scraped her knee. No head trauma. No prior injuries to left ankle. No medications taken prior to arrival.  Pt is confident she is not pregnant. Pt is not lactating.   The history is provided by the patient.       Past Medical History:  Diagnosis Date  . Hypertension     Patient Active Problem List   Diagnosis Date Noted  . Postoperative state 03/29/2018    Past Surgical History:  Procedure Laterality Date  . CESAREAN SECTION    . CESAREAN SECTION N/A 03/29/2018   Procedure: CESAREAN SECTION;  Surgeon: Carrington Clamp, MD;  Location: [facility] LD ORS;  Service: Obstetrics;  Laterality: N/A;     OB History    Gravida  3   Para  2   Term  2   Preterm      AB  1   Living  2     SAB      TAB  1   Ectopic      Multiple  0   Live Births  1           History reviewed. No pertinent family history.  Social History   Tobacco Use  . Smoking status: Never Smoker  . Smokeless tobacco: Never Used  Substance Use Topics  . Alcohol use: Not Currently  . Drug use: Never    Home Medications Prior to Admission medications   Medication Sig Start Date End Date Taking? Authorizing Provider  oxyCODONE-acetaminophen (PERCOCET/ROXICET) 5-325 MG tablet Take 1-2 tablets by mouth every 4 (four) hours as needed for moderate pain. 03/31/18   Philip Aspen, DO    Allergies    Patient has no known allergies.  Review of Systems   Review  of Systems  Musculoskeletal: Positive for arthralgias and joint swelling.  Skin: Positive for wound.    Physical Exam Updated Vital Signs BP (!) 140/96   Pulse 83   Temp 98.1 F (36.7 C) (Oral)   Resp 19   SpO2 99%   Physical Exam Vitals and nursing note reviewed.  Constitutional:      General: She is not in acute distress.    Appearance: She is well-developed.  HENT:     Head: Normocephalic and atraumatic.  Eyes:     Conjunctiva/sclera: Conjunctivae normal.  Cardiovascular:     Rate and Rhythm: Normal rate.  Pulmonary:     Effort: Pulmonary effort is normal.  Musculoskeletal:     Comments: Left ankle with swelling and TTP over the lateral aspect, just anterior to the lateral malleolus. No deformity. No wounds to ankle. Normal sensation. Pain with ROM. Intact DP pulse Superficial abrasion to left knee, normal ROM, no swelling or deformity.  Neurological:     Mental Status: She is alert.  Psychiatric:        Mood and Affect: Mood normal.        Behavior: Behavior normal.  ED Results / Procedures / Treatments   Labs (all labs ordered are listed, but only abnormal results are displayed) Labs Reviewed - No data to display  EKG None  Radiology DG Ankle Complete Left  Result Date: 06/22/2019 CLINICAL DATA:  Posttraumatic left foot and ankle pain EXAM: LEFT ANKLE COMPLETE - 3+ VIEW COMPARISON:  None. FINDINGS: Small avulsion fracture fragments near the fifth metatarsal base and lateral cuboid. No fracture or malalignment at the ankle IMPRESSION: Small avulsion fracture from the fifth metatarsal base or lateral cuboid. Electronically Signed   By: Monte Fantasia M.D.   On: 06/22/2019 07:14   DG Foot Complete Left  Result Date: 06/22/2019 CLINICAL DATA:  Posttraumatic left foot and ankle pain. EXAM: LEFT FOOT - COMPLETE 3+ VIEW COMPARISON:  None. FINDINGS: There is no evidence of fracture or dislocation. There is no evidence of arthropathy or other focal bone abnormality.  Soft tissues are unremarkable. IMPRESSION: Negative. Electronically Signed   By: Monte Fantasia M.D.   On: 06/22/2019 07:13    Procedures Procedures (including critical care time)  Medications Ordered in ED Medications  meloxicam (MOBIC) tablet 7.5 mg (7.5 mg Oral Given 06/22/19 0824)  bacitracin 500 UNIT/GM ointment (1 application  Given 02/23/95 0813)    ED Course  I have reviewed the triage vital signs and the nursing notes.  Pertinent labs & imaging results that were available during my care of the patient were reviewed by me and considered in my medical decision making (see chart for details).    MDM Rules/Calculators/A&P                      Pt with small avulsion fracture of left ankle, likely 2/t ankle sprain after rolling her ankle this morning. NV intact. Will place in cam walker for support and immobilization. Crutches to avoid weight bearing. RICE therapy, NSAIDs for pain. Will provide ortho follow up. Pt agreeable w plan.  Discussed results, findings, treatment and follow up. Patient advised of return precautions. Patient verbalized understanding and agreed with plan. Final Clinical Impression(s) / ED Diagnoses Final diagnoses:  Sprain of left ankle, unspecified ligament, initial encounter  Avulsion fracture of ankle, left, closed, initial encounter    Rx / DC Orders ED Discharge Orders    None       Wilna Pennie, [place] N, PA-C 06/22/19 9892    Charlesetta Shanks, MD 06/30/19 1349

## 2020-06-14 ENCOUNTER — Other Ambulatory Visit: Payer: Self-pay

## 2020-06-14 ENCOUNTER — Ambulatory Visit: Payer: Medicaid Other | Admitting: Student

## 2020-06-14 VITALS — BP 132/76 | HR 96 | Temp 98.3°F | Ht 61.0 in | Wt 293.5 lb

## 2020-06-14 DIAGNOSIS — Z1159 Encounter for screening for other viral diseases: Secondary | ICD-10-CM | POA: Diagnosis not present

## 2020-06-14 DIAGNOSIS — Z124 Encounter for screening for malignant neoplasm of cervix: Secondary | ICD-10-CM | POA: Diagnosis not present

## 2020-06-14 DIAGNOSIS — Z0000 Encounter for general adult medical examination without abnormal findings: Secondary | ICD-10-CM | POA: Insufficient documentation

## 2020-06-14 DIAGNOSIS — Z30430 Encounter for insertion of intrauterine contraceptive device: Secondary | ICD-10-CM | POA: Diagnosis not present

## 2020-06-14 DIAGNOSIS — Z713 Dietary counseling and surveillance: Secondary | ICD-10-CM

## 2020-06-14 DIAGNOSIS — Z114 Encounter for screening for human immunodeficiency virus [HIV]: Secondary | ICD-10-CM | POA: Diagnosis not present

## 2020-06-14 NOTE — Progress Notes (Signed)
   CC: establish PCP, weight loss  HPI:  Cheryl Nolan is a 30 y.o. female with history listed below presenting to the [facility] to establish PCP and for weight loss management. Please see individualized problem based charting for full HPI.  Past Medical History: -Gestational HTN (during 1st pregnancy) - resolved -Severe Obesity  Medications: None  Allergies: No known allergies  Social History: -Mother of two children -Consulting civil engineer at [employer] -No history of smoking or illicit drug use; occasional alcohol use (only socially)  Family History: -No pertinent family history  Surgical History: None  Review of Systems:  Negative aside from that listed in individualized problem based charting.  Physical Exam:  Vitals:   06/14/20 0836  BP: 132/76  Pulse: 96  Temp: 98.3 F (36.8 C)  TempSrc: Oral  SpO2: 100%  Weight: 293 lb 8 oz (133.1 kg)  Height: 5\' 1"  (1.549 m)   Physical Exam Constitutional:      Comments: Severely obese  HENT:     Head: Normocephalic and atraumatic.     Right Ear: Tympanic membrane normal.     Left Ear: Tympanic membrane normal.     Nose: Nose normal.     Mouth/Throat:     Mouth: Mucous membranes are moist.     Pharynx: Oropharynx is clear. No oropharyngeal exudate.  Eyes:     Extraocular Movements: Extraocular movements intact.     Conjunctiva/sclera: Conjunctivae normal.     Pupils: Pupils are equal, round, and reactive to light.  Cardiovascular:     Rate and Rhythm: Normal rate and regular rhythm.     Pulses: Normal pulses.     Heart sounds: Normal heart sounds.  Pulmonary:     Effort: Pulmonary effort is normal.     Breath sounds: Normal breath sounds. No wheezing, rhonchi or rales.  Abdominal:     General: Bowel sounds are normal. There is no distension.     Palpations: Abdomen is soft.     Tenderness: There is no abdominal tenderness.  Musculoskeletal:        General: No swelling. Normal range of motion.      Cervical back: Normal range of motion.  Skin:    General: Skin is warm and dry.     Capillary Refill: Capillary refill takes less than 2 seconds.  Neurological:     General: No focal deficit present.     Mental Status: She is alert and oriented to person, place, and time.  Psychiatric:        Mood and Affect: Mood normal.        Behavior: Behavior normal.      Assessment & Plan:   See Encounters Tab for problem based charting.  Patient discussed with Dr. 

## 2020-06-14 NOTE — Patient Instructions (Addendum)
Ms Guilbault,  It was a pleasure seeing you in the clinic today.   1. I have placed a referral to see Gynecology for your breast exam, PAP smear, and for IUD placement. Someone will call you to set this appointment up.  2. Please incorporate diet and exercise into your lifestyle for weight loss. Low-fat/low-calorie diet or Mediterranean diet would be very beneficial. For exercise, please perform 150 minutes or more of exercise each week.  3. Please come back in 5 months so we can reassess your weight loss at that time.  Please call our clinic at [phone] if you have any questions or concerns. The best time to call is Monday-Friday from 9am-4pm, but there is someone available 24/7 at the same number. If you need medication refills, please notify your pharmacy one week in advance and they will send [place] a request.   Thank you for letting us take part in your care. We look forward to seeing you next time!   Calorie Counting for Weight Loss Calories are units of energy. Your body needs a certain number of calories from food to keep going throughout the day. When you eat or drink more calories than your body needs, your body stores the extra calories mostly as fat. When you eat or drink fewer calories than your body needs, your body burns fat to get the energy it needs. Calorie counting means keeping track of how many calories you eat and drink each day. Calorie counting can be helpful if you need to lose weight. If you eat fewer calories than your body needs, you should lose weight. Ask your health care provider what a healthy weight is for you. For calorie counting to work, you will need to eat the right number of calories each day to lose a healthy amount of weight per week. A dietitian can help you figure out how many calories you need in a day and will suggest ways to reach your calorie goal.  A healthy amount of weight to lose each week is usually 1-2 lb (0.5-0.9 kg). This usually means that your  daily calorie intake should be reduced by 500-750 calories.  Eating 1,200-1,500 calories a day can help most women lose weight.  Eating 1,500-1,800 calories a day can help most men lose weight. What do I need to know about calorie counting? Work with your health care provider or dietitian to determine how many calories you should get each day. To meet your daily calorie goal, you will need to:  Find out how many calories are in each food that you would like to eat. Try to do this before you eat.  Decide how much of the food you plan to eat.  Keep a food log. Do this by writing down what you ate and how many calories it had. To successfully lose weight, it is important to balance calorie counting with a healthy lifestyle that includes regular activity. Where do I find calorie information? The number of calories in a food can be found on a Nutrition Facts label. If a food does not have a Nutrition Facts label, try to look up the calories online or ask your dietitian for help. Remember that calories are listed per serving. If you choose to have more than one serving of a food, you will have to multiply the calories per serving by the number of servings you plan to eat. For example, the label on a package of bread might say that a serving size is 1  slice and that there are 90 calories in a serving. If you eat 1 slice, you will have eaten 90 calories. If you eat 2 slices, you will have eaten 180 calories.   How do I keep a food log? After each time that you eat, record the following in your food log as soon as possible:  What you ate. Be sure to include toppings, sauces, and other extras on the food.  How much you ate. This can be measured in cups, ounces, or number of items.  How many calories were in each food and drink.  The total number of calories in the food you ate. Keep your food log near you, such as in a pocket-sized notebook or on an app or website on your mobile phone. Some programs  will calculate calories for you and show you how many calories you have left to meet your daily goal. What are some portion-control tips?  Know how many calories are in a serving. This will help you know how many servings you can have of a certain food.  Use a measuring cup to measure serving sizes. You could also try weighing out portions on a kitchen scale. With time, you will be able to estimate serving sizes for some foods.  Take time to put servings of different foods on your favorite plates or in your favorite bowls and cups so you know what a serving looks like.  Try not to eat straight from a food's packaging, such as from a bag or box. Eating straight from the package makes it hard to see how much you are eating and can lead to overeating. Put the amount you would like to eat in a cup or on a plate to make sure you are eating the right portion.  Use smaller plates, glasses, and bowls for smaller portions and to prevent overeating.  Try not to multitask. For example, avoid watching TV or using your computer while eating. If it is time to eat, sit down at a table and enjoy your food. This will help you recognize when you are full. It will also help you be more mindful of what and how much you are eating. What are tips for following this plan? Reading food labels  Check the calorie count compared with the serving size. The serving size may be smaller than what you are used to eating.  Check the source of the calories. Try to choose foods that are high in protein, fiber, and vitamins, and low in saturated fat, trans fat, and sodium. Shopping  Read nutrition labels while you shop. This will help you make healthy decisions about which foods to buy.  Pay attention to nutrition labels for low-fat or fat-free foods. These foods sometimes have the same number of calories or more calories than the full-fat versions. They also often have added sugar, starch, or salt to make up for flavor that  was removed with the fat.  Make a grocery list of lower-calorie foods and stick to it. Cooking  Try to cook your favorite foods in a healthier way. For example, try baking instead of frying.  Use low-fat dairy products. Meal planning  Use more fruits and vegetables. One-half of your plate should be fruits and vegetables.  Include lean proteins, such as chicken, [place], and fish. Lifestyle Each week, aim to do one of the following:  150 minutes of moderate exercise, such as walking.  75 minutes of vigorous exercise, such as running. General information  Know  how many calories are in the foods you eat most often. This will help you calculate calorie counts faster.  Find a way of tracking calories that works for you. Get creative. Try different apps or programs if writing down calories does not work for you. What foods should I eat?  Eat nutritious foods. It is better to have a nutritious, high-calorie food, such as an avocado, than a food with few nutrients, such as a bag of potato chips.  Use your calories on foods and drinks that will fill you up and will not leave you hungry soon after eating. ? Examples of foods that fill you up are nuts and nut butters, vegetables, lean proteins, and high-fiber foods such as whole grains. High-fiber foods are foods with more than 5 g of fiber per serving.  Pay attention to calories in drinks. Low-calorie drinks include water and unsweetened drinks. The items listed above may not be a complete list of foods and beverages you can eat. Contact a dietitian for more information.   What foods should I limit? Limit foods or drinks that are not good sources of vitamins, minerals, or protein or that are high in unhealthy fats. These include:  Candy.  Other sweets.  Sodas, specialty coffee drinks, alcohol, and juice. The items listed above may not be a complete list of foods and beverages you should avoid. Contact a dietitian for more  information. How do I count calories when eating out?  Pay attention to portions. Often, portions are much larger when eating out. Try these tips to keep portions smaller: ? Consider sharing a meal instead of getting your own. ? If you get your own meal, eat only half of it. Before you start eating, ask for a container and put half of your meal into it. ? When available, consider ordering smaller portions from the menu instead of full portions.  Pay attention to your food and drink choices. Knowing the way food is cooked and what is included with the meal can help you eat fewer calories. ? If calories are listed on the menu, choose the lower-calorie options. ? Choose dishes that include vegetables, fruits, whole grains, low-fat dairy products, and lean proteins. ? Choose items that are boiled, broiled, grilled, or steamed. Avoid items that are buttered, battered, fried, or served with cream sauce. Items labeled as crispy are usually fried, unless stated otherwise. ? Choose water, low-fat milk, unsweetened iced tea, or other drinks without added sugar. If you want an alcoholic beverage, choose a lower-calorie option, such as a glass of wine or light beer. ? Ask for dressings, sauces, and syrups on the side. These are usually high in calories, so you should limit the amount you eat. ? If you want a salad, choose a garden salad and ask for grilled meats. Avoid extra toppings such as bacon, cheese, or fried items. Ask for the dressing on the side, or ask for olive oil and vinegar or lemon to use as dressing.  Estimate how many servings of a food you are given. Knowing serving sizes will help you be aware of how much food you are eating at restaurants. Where to find more information  Centers for Disease Control and Prevention: FootballExhibition.com.br  [place] Department of Agriculture: WrestlingReporter.dk Summary  Calorie counting means keeping track of how many calories you eat and drink each day. If you eat fewer  calories than your body needs, you should lose weight.  A healthy amount of weight to lose per week  is usually 1-2 lb (0.5-0.9 kg). This usually means reducing your daily calorie intake by 500-750 calories.  The number of calories in a food can be found on a Nutrition Facts label. If a food does not have a Nutrition Facts label, try to look up the calories online or ask your dietitian for help.  Use smaller plates, glasses, and bowls for smaller portions and to prevent overeating.  Use your calories on foods and drinks that will fill you up and not leave you hungry shortly after a meal. This information is not intended to replace advice given to you by your health care provider. Make sure you discuss any questions you have with your health care provider. Document Revised: 02/18/2019 Document Reviewed: 02/18/2019 Elsevier Patient Education  2021 Elsevier Inc.    Mediterranean Diet A Mediterranean diet refers to food and lifestyle choices that are based on the traditions of countries located on the [employer] [place]. This way of eating has been shown to help prevent certain conditions and improve outcomes for people who have chronic diseases, like kidney disease and heart disease. What are tips for following this plan? Lifestyle  Cook and eat meals together with your family, when possible.  Drink enough fluid to keep your urine clear or pale yellow.  Be physically active every day. This includes: ? Aerobic exercise like running or swimming. ? Leisure activities like gardening, walking, or housework.  Get 7-8 hours of sleep each night.  If recommended by your health care provider, drink red wine in moderation. This means 1 glass a day for nonpregnant women and 2 glasses a day for men. A glass of wine equals 5 oz (150 mL). Reading food labels  Check the serving size of packaged foods. For foods such as rice and pasta, the serving size refers to the amount of cooked product, not  dry.  Check the total fat in packaged foods. Avoid foods that have saturated fat or trans fats.  Check the ingredients list for added sugars, such as corn syrup.   Shopping  At the grocery store, buy most of your food from the areas near the walls of the store. This includes: ? Fresh fruits and vegetables (produce). ? Grains, beans, nuts, and seeds. Some of these may be available in unpackaged forms or large amounts (in bulk). ? Fresh seafood. ? Poultry and eggs. ? Low-fat dairy products.  Buy whole ingredients instead of prepackaged foods.  Buy fresh fruits and vegetables in-season from local farmers markets.  Buy frozen fruits and vegetables in resealable bags.  If you do not have access to quality fresh seafood, buy precooked frozen shrimp or canned fish, such as tuna, salmon, or sardines.  Buy small amounts of raw or cooked vegetables, salads, or olives from the deli or salad bar at your store.  Stock your pantry so you always have certain foods on hand, such as olive oil, canned tuna, canned tomatoes, rice, pasta, and beans. Cooking  Cook foods with extra-virgin olive oil instead of using butter or other vegetable oils.  Have meat as a side dish, and have vegetables or grains as your main dish. This means having meat in small portions or adding small amounts of meat to foods like pasta or stew.  Use beans or vegetables instead of meat in common dishes like chili or lasagna.  Experiment with different cooking methods. Try roasting or broiling vegetables instead of steaming or sauteing them.  Add frozen vegetables to soups, stews, pasta, or rice.  Add nuts or seeds for added healthy fat at each meal. You can add these to yogurt, salads, or vegetable dishes.  Marinate fish or vegetables using olive oil, lemon juice, garlic, and fresh herbs. Meal planning  Plan to eat 1 vegetarian meal one day each week. Try to work up to 2 vegetarian meals, if possible.  Eat seafood 2  or more times a week.  Have healthy snacks readily available, such as: ? Vegetable sticks with hummus. ? [place] yogurt. ? Fruit and nut trail mix.  Eat balanced meals throughout the week. This includes: ? Fruit: 2-3 servings a day ? Vegetables: 4-5 servings a day ? Low-fat dairy: 2 servings a day ? Fish, poultry, or lean meat: 1 serving a day ? Beans and legumes: 2 or more servings a week ? Nuts and seeds: 1-2 servings a day ? Whole grains: 6-8 servings a day ? Extra-virgin olive oil: 3-4 servings a day  Limit red meat and sweets to only a few servings a month   What are my food choices?  Mediterranean diet ? Recommended  Grains: Whole-grain pasta. Brown rice. Bulgar wheat. Polenta. Couscous. Whole-wheat bread. Orp[place] Cobbatmeal. Quinoa.  Vegetables: Artichokes. Beets. Broccoli. Cabbage. Carrots. Eggplant. Green beans. Chard. Kale. Spinach. Onions. Leeks. Peas. Squash. Tomatoes. Peppers. Radishes.  Fruits: Apples. Apricots. Avocado. Berries. Bananas. Cherries. Dates. Figs. Grapes. Lemons. Melon. Oranges. Peaches. Plums. Pomegranate.  Meats and other protein foods: Beans. Almonds. Sunflower seeds. Pine nuts. Peanuts. Cod. Salmon. Scallops. Shrimp. Tuna. Tilapia. Clams. Oysters. Eggs.  Dairy: Low-fat milk. Cheese. Greek yogurt.  Beverages: Water. Red wine. Herbal tea.  Fats and oils: Extra virgin olive oil. Avocado oil. Grape seed oil.  Sweets and desserts: [place] yogurt with honey. Baked apples. Poached pears. Trail mix.  Seasoning and other foods: Basil. Cilantro. Coriander. Cumin. Mint. Parsley. Sage. Rosemary. Tarragon. Garlic. Oregano. Thyme. Pepper. Balsalmic vinegar. Tahini. Hummus. Tomato sauce. Olives. Mushrooms. ? Limit these  Grains: Prepackaged pasta or rice dishes. Prepackaged cereal with added sugar.  Vegetables: Deep fried potatoes (french fries).  Fruits: Fruit canned in syrup.  Meats and other protein foods: Beef. Pork. Lamb. Poultry with skin. Hot dogs.  Tomasa BlaseBacon.  Dairy: Ice cream. Sour cream. Whole milk.  Beverages: Juice. Sugar-sweetened soft drinks. Beer. Liquor and spirits.  Fats and oils: Butter. Canola oil. Vegetable oil. Beef fat (tallow). Lard.  Sweets and desserts: Cookies. Cakes. Pies. Candy.  Seasoning and other foods: Mayonnaise. Premade sauces and marinades. The items listed may not be a complete list. Talk with your dietitian about what dietary choices are right for you. Summary  The Mediterranean diet includes both food and lifestyle choices.  Eat a variety of fresh fruits and vegetables, beans, nuts, seeds, and whole grains.  Limit the amount of red meat and sweets that you eat.  Talk with your health care provider about whether it is safe for you to drink red wine in moderation. This means 1 glass a day for nonpregnant women and 2 glasses a day for men. A glass of wine equals 5 oz (150 mL). This information is not intended to replace advice given to you by your health care provider. Make sure you discuss any questions you have with your health care provider. Document Revised: 09/07/2015 Document Reviewed: 08/31/2015 Elsevier Patient Education  2020 Elsevier Inc.    Heart-Healthy Eating Plan Many factors influence your heart (coronary) health, including eating and exercise habits. Coronary risk increases with abnormal blood fat (lipid) levels. Heart-healthy meal planning includes limiting unhealthy fats, increasing  healthy fats, and making other diet and lifestyle changes. What is my plan? Your health care provider may recommend that you:  Limit your fat intake to _________% or less of your total calories each day.  Limit your saturated fat intake to _________% or less of your total calories each day.  Limit the amount of cholesterol in your diet to less than _________ mg per day. What are tips for following this plan? Cooking Cook foods using methods other than frying. Baking, boiling, grilling, and broiling are  all good options. Other ways to reduce fat include:  Removing the skin from poultry.  Removing all visible fats from meats.  Steaming vegetables in water or broth. Meal planning  At meals, imagine dividing your plate into fourths: ? Fill one-half of your plate with vegetables and green salads. ? Fill one-fourth of your plate with whole grains. ? Fill one-fourth of your plate with lean protein foods.  Eat 4-5 servings of vegetables per day. One serving equals 1 cup raw or cooked vegetable, or 2 cups raw leafy greens.  Eat 4-5 servings of fruit per day. One serving equals 1 medium whole fruit,  cup dried fruit,  cup fresh, frozen, or canned fruit, or  cup 100% fruit juice.  Eat more foods that contain soluble fiber. Examples include apples, broccoli, carrots, beans, peas, and barley. Aim to get 25-30 g of fiber per day.  Increase your consumption of legumes, nuts, and seeds to 4-5 servings per week. One serving of dried beans or legumes equals  cup cooked, 1 serving of nuts is  cup, and 1 serving of seeds equals 1 tablespoon.   Fats  Choose healthy fats more often. Choose monounsaturated and polyunsaturated fats, such as olive and canola oils, flaxseeds, walnuts, almonds, and seeds.  Eat more omega-3 fats. Choose salmon, mackerel, sardines, tuna, flaxseed oil, and ground flaxseeds. Aim to eat fish at least 2 times each week.  Check food labels carefully to identify foods with trans fats or high amounts of saturated fat.  Limit saturated fats. These are found in animal products, such as meats, butter, and cream. Plant sources of saturated fats include palm oil, palm kernel oil, and coconut oil.  Avoid foods with partially hydrogenated oils in them. These contain trans fats. Examples are stick margarine, some tub margarines, cookies, crackers, and other baked goods.  Avoid fried foods. General information  Eat more home-cooked food and less restaurant, buffet, and fast  food.  Limit or avoid alcohol.  Limit foods that are high in starch and sugar.  Lose weight if you are overweight. Losing just 5-10% of your body weight can help your overall health and prevent diseases such as diabetes and heart disease.  Monitor your salt (sodium) intake, especially if you have high blood pressure. Talk with your health care provider about your sodium intake.  Try to incorporate more vegetarian meals weekly. What foods can I eat? Fruits All fresh, canned (in natural juice), or frozen fruits. Vegetables Fresh or frozen vegetables (raw, steamed, roasted, or grilled). Green salads. Grains Most grains. Choose whole wheat and whole grains most of the time. Rice and pasta, including brown rice and pastas made with whole wheat. Meats and other proteins Lean, well-trimmed beef, veal, pork, and lamb. Chicken and [place] without skin. All fish and shellfish. Wild duck, rabbit, pheasant, and venison. Egg whites or low-cholesterol egg substitutes. Dried beans, peas, lentils, and tofu. Seeds and most nuts. Dairy Low-fat or nonfat cheeses, including ricotta and mozzarella.  Skim or 1% milk (liquid, powdered, or evaporated). Buttermilk made with low-fat milk. Nonfat or low-fat yogurt. Fats and oils Non-hydrogenated (trans-free) margarines. Vegetable oils, including soybean, sesame, sunflower, olive, peanut, safflower, corn, canola, and cottonseed. Salad dressings or mayonnaise made with a vegetable oil. Beverages Water (mineral or sparkling). Coffee and tea. Diet carbonated beverages. Sweets and desserts Sherbet, gelatin, and fruit ice. Small amounts of dark chocolate. Limit all sweets and desserts. Seasonings and condiments All seasonings and condiments. The items listed above may not be a complete list of foods and beverages you can eat. Contact a dietitian for more options. What foods are not recommended? Fruits Canned fruit in heavy syrup. Fruit in cream or butter sauce. Fried  fruit. Limit coconut. Vegetables Vegetables cooked in cheese, cream, or butter sauce. Fried vegetables. Grains Breads made with saturated or trans fats, oils, or whole milk. Croissants. Sweet rolls. Donuts. High-fat crackers, such as cheese crackers. Meats and other proteins Fatty meats, such as hot dogs, ribs, sausage, bacon, rib-eye roast or steak. High-fat deli meats, such as salami and bologna. Caviar. Domestic duck and goose. Organ meats, such as liver. Dairy Cream, sour cream, cream cheese, and creamed cottage cheese. Whole milk cheeses. Whole or 2% milk (liquid, evaporated, or condensed). Whole buttermilk. Cream sauce or high-fat cheese sauce. Whole-milk yogurt. Fats and oils Meat fat, or shortening. Cocoa butter, hydrogenated oils, palm oil, coconut oil, palm kernel oil. Solid fats and shortenings, including bacon fat, salt pork, lard, and butter. Nondairy cream substitutes. Salad dressings with cheese or sour cream. Beverages Regular sodas and any drinks with added sugar. Sweets and desserts Frosting. Pudding. Cookies. Cakes. Pies. Milk chocolate or white chocolate. Buttered syrups. Full-fat ice cream or ice cream drinks. The items listed above may not be a complete list of foods and beverages to avoid. Contact a dietitian for more information. Summary  Heart-healthy meal planning includes limiting unhealthy fats, increasing healthy fats, and making other diet and lifestyle changes.  Lose weight if you are overweight. Losing just 5-10% of your body weight can help your overall health and prevent diseases such as diabetes and heart disease.  Focus on eating a balance of foods, including fruits and vegetables, low-fat or nonfat dairy, lean protein, nuts and legumes, whole grains, and heart-healthy oils and fats. This information is not intended to replace advice given to you by your health care provider. Make sure you discuss any questions you have with your health care  provider. Document Revised: 02/14/2017 Document Reviewed: 02/14/2017 Elsevier Patient Education  2021 [employer].

## 2020-06-14 NOTE — Assessment & Plan Note (Signed)
Patient due for hepatitis C and HIV screening, will obtain both today.  Patient also requesting referral to Gynecology for breast exam, PAP smear, and visit for IUD placement. Placed referral to ensure these are completed.  Plan: -f/u Hep C and HIV screening tests -referral to gynecology

## 2020-06-14 NOTE — Assessment & Plan Note (Addendum)
Patient is a young woman with severe obesity (BMI 55.46).  She states that she has been trying to lose weight over the past few months but has been finding it very difficult due to her schedule.  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  States that she has times where she will be able to exercise consistently and does note some weight loss with it but she is unable to maintain this.  Reports that her diet is "not too bad" but does note that she consumes snacks from time to time as this is what her children eat.  We discussed multiple options for weight loss including diet/exercise, medications, bariatric surgery.  As far as medications, discussed risks and benefits of injectables such as liraglutide and semaglutide but she notes that she does not like needles and thus would prefer an oral medication.  Discussed risks and benefits of orlistat and phentermine-topiramate, she states that either of these would likely be her preference.  However she does note that she would like to try diet and exercise first prior to trying weight loss medications.  Also discussed bariatric surgery showed diet/exercise and weight loss medications not be effective.  She reports that she would consider it at that time.  I printed out pamphlets on low-fat/low calorie diet and Mediterranean diet for her to attempt.  Discussed benefit of meeting with Lupita Leash for nutrition management, which she agrees with. Also counseled her on exercise for at least 150 minutes each week.  She reports that she will incorporate this into her lifestyle.  Plan: -low-fat/low-calorie diet or Mediterranean diet -referral to diabetes and nutrition management -exercise for >150 minutes per week -f/u in 5 months to reevaluate weight  -should diet and exercise not produce effective weight loss, consider oral medications such as orlistat or phentermine-topiramate to assist with weight loss -bariatric surgery counseling if  above not effective

## 2020-06-15 LAB — HEPATITIS C ANTIBODY: Hep C Virus Ab: <0.1 s/co ratio (ref 0.0–0.9)

## 2020-06-15 LAB — HIV ANTIBODY (ROUTINE TESTING W REFLEX): HIV Screen 4th Generation wRfx: NONREACTIVE

## 2020-06-15 NOTE — Progress Notes (Signed)
Internal Medicine Clinic Attending  Case discussed with Dr. Jinwala  At the time of the visit.  We reviewed the resident's history and exam and pertinent patient test results.  I agree with the assessment, diagnosis, and plan of care documented in the resident's note.  

## 2020-08-01 ENCOUNTER — Ambulatory Visit: Payer: Medicaid Other | Admitting: Obstetrics

## 2020-08-02 ENCOUNTER — Encounter: Payer: Self-pay | Admitting: Student

## 2020-08-24 ENCOUNTER — Ambulatory Visit: Payer: Medicaid Other | Admitting: Obstetrics

## 2020-08-24 NOTE — Addendum Note (Signed)
Addended by: Neomia Dear on: 08/24/2020 06:38 PM   Modules accepted: Orders

## 2020-08-30 NOTE — Addendum Note (Signed)
Addended by: Neomia Dear on: 08/30/2020 06:48 PM   Modules accepted: Orders

## 2020-09-27 ENCOUNTER — Encounter: Payer: Medicaid Other | Admitting: Student

## 2020-10-03 ENCOUNTER — Ambulatory Visit (INDEPENDENT_AMBULATORY_CARE_PROVIDER_SITE_OTHER): Payer: Medicaid Other | Admitting: Internal Medicine

## 2020-10-03 VITALS — BP 138/89 | HR 96 | Ht 61.0 in | Wt 295.7 lb

## 2020-10-03 DIAGNOSIS — Z713 Dietary counseling and surveillance: Secondary | ICD-10-CM | POA: Diagnosis not present

## 2020-10-03 NOTE — Patient Instructions (Signed)
Thank you, Cheryl Nolan for allowing [place] to provide your care today. Today we discussed: Weight loss: Continue with low fat/low calorie diet and exercise about 150 minutes a week. I have placed a consult for our nutritionist and she will be giving you a call.      I have ordered the following labs for you:  Lab Orders  No laboratory test(s) ordered today     Referrals ordered today:    Referral Orders         Referral to Nutrition and Diabetes Services      I have ordered the following medication/changed the following medications:   Stop the following medications: There are no discontinued medications.   Start the following medications: No orders of the defined types were placed in this encounter.    Follow up: 4 months    Should you have any questions or concerns please call the internal medicine clinic at [phone].     Elza Rafter, D.O. [facility]

## 2020-10-03 NOTE — Assessment & Plan Note (Signed)
Patient is a young female with severe obesity (BMI 55.87). She presents today for a follow up regarding weight loss. She notes that she has tried to adhere to low fat/low calorie diet, and was experiencing some success with this, however, she has a hard time sticking with this with her school schedule and her young kids. She also notes that she has tried exercising recently (push ups, weights, etc), although this schedule has not been consistent as well, secondary to her busy schedule. She notes that now that her son is back with his father, she will try to go to the gym more regularly. Overall, the patient is up 2 lbs since her last visit ~4 months ago.   Discussed weight loss options again today. Patient does not wish to pursue any of the weight loss medications that were discussed at the last visit at this time (liraglutide/semaglutide, orlistat,  phentermine-topirimate). She would like to continue with diet and exercise/lifestyle modifications for now and will reevaluate her other options at her next visit. Patient would like to see a nutritionist to help her with her dietary modifications.   Plan: - Continue with low fat/low calorie diet - Advised to exercise at least 150 minutes each week - Referral placed to [facility] for nutrition management  - Could consider bariatric surgery in the future if none of the above options are successful

## 2020-10-03 NOTE — Progress Notes (Signed)
   CC: weight loss discussion  HPI:  Cheryl Nolan is a 30 y.o. female with obesity who presents to the [facility] to discuss weight loss. Please see problem-based list for further details, assessments, and plans.   Past Medical History:  Diagnosis Date   Hypertension    Review of Systems:  Review of Systems  Constitutional:  Negative for chills, fever and weight loss.  HENT: Negative.    Eyes: Negative.   Respiratory: Negative.    Cardiovascular:  Negative for chest pain and palpitations.  Gastrointestinal:  Negative for abdominal pain, diarrhea, nausea and vomiting.  Genitourinary: Negative.   Musculoskeletal: Negative.     Physical Exam:  Vitals:   10/03/20 1612  BP: 138/89  Pulse: 96  SpO2: 100%  Weight: 295 lb 11.2 oz (134.1 kg)   General:  No acute distress. CV: RRR. No murmurs, rubs, or gallops. No LE edema Pulmonary: Lungs CTAB. Normal effort. No wheezing or rales. Abdominal: Soft, nontender, nondistended. Normal bowel sounds. Extremities: Palpable radial and DP pulses. Normal ROM. Skin: Warm and dry. No obvious rash or lesions. Neuro: A&Ox3. Moves all extremities. Normal sensation. No focal deficit. Psych: Normal mood and affect   Assessment & Plan:   See Encounters Tab for problem based charting.  Patient seen with Dr. Criselda Peaches

## 2020-10-10 ENCOUNTER — Ambulatory Visit: Payer: Medicaid Other | Admitting: Obstetrics

## 2020-10-13 NOTE — Progress Notes (Signed)
Internal Medicine Clinic Attending ° °I saw and evaluated the patient.  I personally confirmed the key portions of the history and exam documented by Dr. Atway and I reviewed pertinent patient test results.  The assessment, diagnosis, and plan were formulated together and I agree with the documentation in the resident’s note.  °

## 2020-11-08 ENCOUNTER — Other Ambulatory Visit (HOSPITAL_COMMUNITY)
Admission: RE | Admit: 2020-11-08 | Discharge: 2020-11-08 | Disposition: A | Discharge status: Home / Self Care | Payer: Medicaid Other | Source: Ambulatory Visit | Attending: Obstetrics | Admitting: Obstetrics

## 2020-11-08 ENCOUNTER — Ambulatory Visit (INDEPENDENT_AMBULATORY_CARE_PROVIDER_SITE_OTHER): Payer: Medicaid Other | Admitting: Obstetrics

## 2020-11-08 ENCOUNTER — Other Ambulatory Visit: Payer: Self-pay

## 2020-11-08 ENCOUNTER — Encounter: Payer: Self-pay | Admitting: Obstetrics

## 2020-11-08 VITALS — BP 134/86 | HR 93 | Ht 61.0 in | Wt 294.7 lb

## 2020-11-08 DIAGNOSIS — F419 Anxiety disorder, unspecified: Secondary | ICD-10-CM | POA: Diagnosis not present

## 2020-11-08 DIAGNOSIS — Z01419 Encounter for gynecological examination (general) (routine) without abnormal findings: Secondary | ICD-10-CM | POA: Insufficient documentation

## 2020-11-08 DIAGNOSIS — N898 Other specified noninflammatory disorders of vagina: Secondary | ICD-10-CM | POA: Insufficient documentation

## 2020-11-08 DIAGNOSIS — Z6841 Body Mass Index (BMI) 40.0 and over, adult: Secondary | ICD-10-CM

## 2020-11-08 NOTE — Progress Notes (Signed)
Subjective:        Cheryl Nolan is a 30 y.o. female here for a routine exam.  Current complaints: Vaginal discharge.    Personal health questionnaire:  Is patient Ashkenazi Jewish, have a family history of breast and/or ovarian cancer: no Is there a family history of uterine cancer diagnosed at age < 37, gastrointestinal cancer, urinary tract cancer, family member who is a Personnel officer syndrome-associated carrier: no Is the patient overweight and hypertensive, family history of diabetes, personal history of gestational diabetes, preeclampsia or PCOS: no Is patient over 42, have PCOS,  family history of premature CHD under age 60, diabetes, smoke, have hypertension or peripheral artery disease:  no At any time, has a partner hit, kicked or otherwise hurt or frightened you?: no Over the past 2 weeks, have you felt down, depressed or hopeless?: no Over the past 2 weeks, have you felt little interest or pleasure in doing things?:no   Gynecologic History Patient's last menstrual period was 09/21/2020 (approximate). Contraception: condoms Last Pap: 2021. Results were: normal Last mammogram: n/a. Results were: n/a  Obstetric History OB History  Gravida Para Term Preterm AB Living  3 2 2   1 2   SAB IAB Ectopic Multiple Live Births    1   0 1    # Outcome Date GA Lbr Len/2nd Weight Sex Delivery Anes PTL Lv  3 Term 03/29/18 [redacted]w[redacted]d  8 lb 6.6 oz (3.816 kg) M CS-LTranv Spinal  LIV  2 Term 10/30/10     CS-Unspec     1 IAB             Past Medical History:  Diagnosis Date   Hypertension     Past Surgical History:  Procedure Laterality Date   CESAREAN SECTION     CESAREAN SECTION N/A 03/29/2018   Procedure: CESAREAN SECTION;  Surgeon: 05/29/2018, MD;  Location: [facility] LD ORS;  Service: Obstetrics;  Laterality: N/A;    No current outpatient medications on file. No Known Allergies  Social History   Tobacco Use   Smoking status: Never   Smokeless tobacco: Never  Substance Use Topics    Alcohol use: Yes    Comment: occasional    History reviewed. No pertinent family history.    Review of Systems  Constitutional: negative for fatigue and weight loss Respiratory: negative for cough and wheezing Cardiovascular: negative for chest pain, fatigue and palpitations Gastrointestinal: negative for abdominal pain and change in bowel habits Musculoskeletal:negative for myalgias Neurological: negative for gait problems and tremors Behavioral/Psych: negative for abusive relationship, depression Endocrine: negative for temperature intolerance    Genitourinary: positive for vaginal discharge.  negative for abnormal menstrual periods, genital lesions, hot flashes, sexual problems  Integument/breast: negative for breast lump, breast tenderness, nipple discharge and skin lesion(s)    Objective:       BP 134/86   Pulse 93   Ht 5\' 1"  (1.549 m)   Wt 294 lb 11.2 oz (133.7 kg)   LMP 09/21/2020 (Approximate)   BMI 55.68 kg/m  General:   Alert and no distress  Skin:   no rash or abnormalities  Lungs:   clear to auscultation bilaterally  Heart:   regular rate and rhythm, S1, S2 normal, no murmur, click, rub or gallop  Breasts:   normal without suspicious masses, skin or nipple changes or axillary nodes  Abdomen:  normal findings: no organomegaly, soft, non-tender and no hernia  Pelvis:  External genitalia: normal general appearance Urinary system: urethral meatus  normal and bladder without fullness, nontender Vaginal: normal without tenderness, induration or masses Cervix: normal appearance Adnexa: normal bimanual exam Uterus: anteverted and non-tender, normal size   Lab Review Urine pregnancy test Labs reviewed yes Radiologic studies reviewed no  I have spent a total of 20 minutes of face-to-face time, excluding clinical staff time, reviewing notes and preparing to see patient, ordering tests and/or medications, and counseling the patient.   Assessment:    1. Encounter  for gynecological examination with Papanicolaou smear of cervix Rx: - Cytology - PAP( [facility])  2. Vaginal discharge Rx: - Cervicovaginal ancillary only( )  3. Anxiety Rx: - Ambulatory referral to Integrated Behavioral Health  4. Class 3 severe obesity due to excess calories without serious comorbidity with body mass index (BMI) of 50.0 to 59.9 in adult (HCC) - weight reduction with the aid of dietary changes, exercise and behavioral modification recommended     Plan:    Education reviewed: calcium supplements, depression evaluation, low fat, low cholesterol diet, safe sex/STD prevention, self breast exams, and weight bearing exercise. Contraception: condoms. Follow up in: 1 year.    Orders Placed This Encounter  Procedures   Ambulatory referral to Integrated Behavioral Health    Referral Priority:   Routine    Referral Type:   Consultation    Referral Reason:   Specialty Services Required    Number of Visits Requested:   1     Brock Bad, MD 11/08/2020 3:24 PM

## 2020-11-08 NOTE — Progress Notes (Signed)
Irregular periods. Bleeding is not heavy. IUD removed 2019 Denies vag. DC/ irritation today.

## 2020-11-09 LAB — CERVICOVAGINAL ANCILLARY ONLY
Bacterial Vaginitis (gardnerella): POSITIVE — AB
Candida Glabrata: NEGATIVE
Candida Vaginitis: NEGATIVE
Chlamydia: NEGATIVE
Comment: NEGATIVE
Comment: NEGATIVE
Comment: NEGATIVE
Comment: NEGATIVE
Comment: NEGATIVE
Comment: NORMAL
Neisseria Gonorrhea: NEGATIVE
Trichomonas: NEGATIVE

## 2020-11-10 ENCOUNTER — Other Ambulatory Visit: Payer: Self-pay | Admitting: Obstetrics

## 2020-11-10 ENCOUNTER — Telehealth: Payer: Self-pay

## 2020-11-10 DIAGNOSIS — B9689 Other specified bacterial agents as the cause of diseases classified elsewhere: Secondary | ICD-10-CM

## 2020-11-10 DIAGNOSIS — N760 Acute vaginitis: Secondary | ICD-10-CM

## 2020-11-10 LAB — CYTOLOGY - PAP: Diagnosis: NEGATIVE

## 2020-11-10 MED ORDER — METRONIDAZOLE 500 MG PO TABS: 500.0000 mg | ORAL_TABLET | Freq: Two times a day (BID) | ORAL | 2 refills | Status: DC

## 2020-11-10 NOTE — Telephone Encounter (Signed)
TC to patient to discuss BV test results. LMTCB

## 2020-11-10 NOTE — Telephone Encounter (Signed)
-----   Message from Brock Bad, MD sent at 11/10/2020  8:01 AM EDT ----- Flagyl Rx for BV

## 2020-11-13 ENCOUNTER — Encounter: Payer: Medicaid Other | Admitting: Licensed Clinical Social Worker

## 2020-12-07 IMAGING — CR DG ANKLE COMPLETE 3+V*L*
3 series · 3 of 3 positions shown · non-contrast
Comparison: None.

CLINICAL DATA: Posttraumatic left foot and ankle pain

EXAM:
LEFT ANKLE COMPLETE - 3+ VIEW

[x ankle ap left]
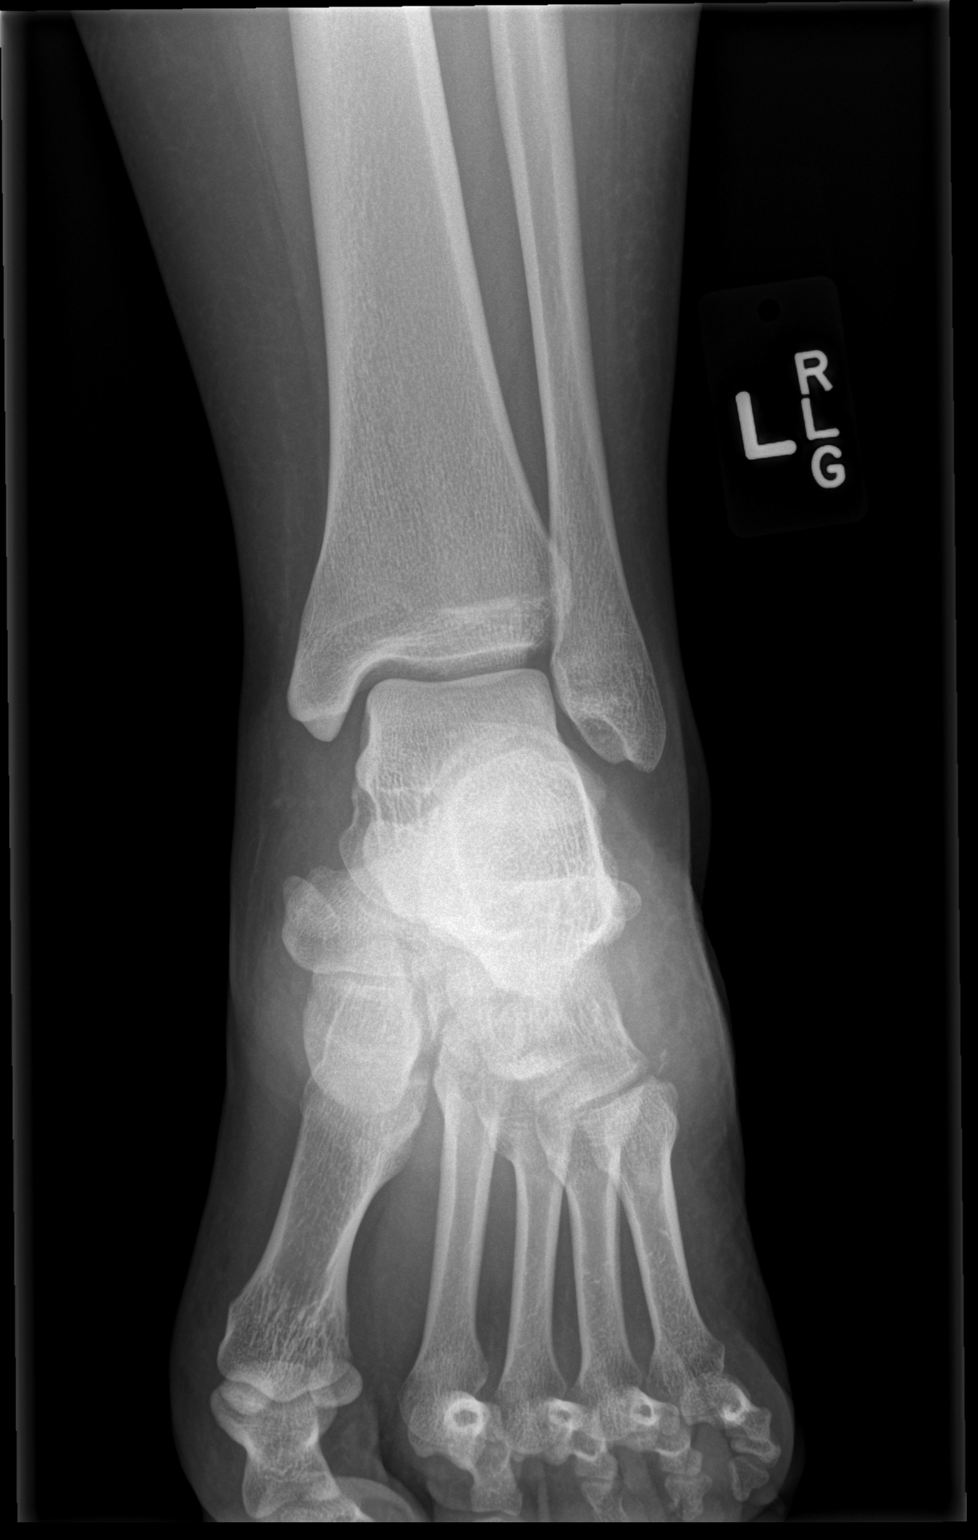

[x ankle obl left]
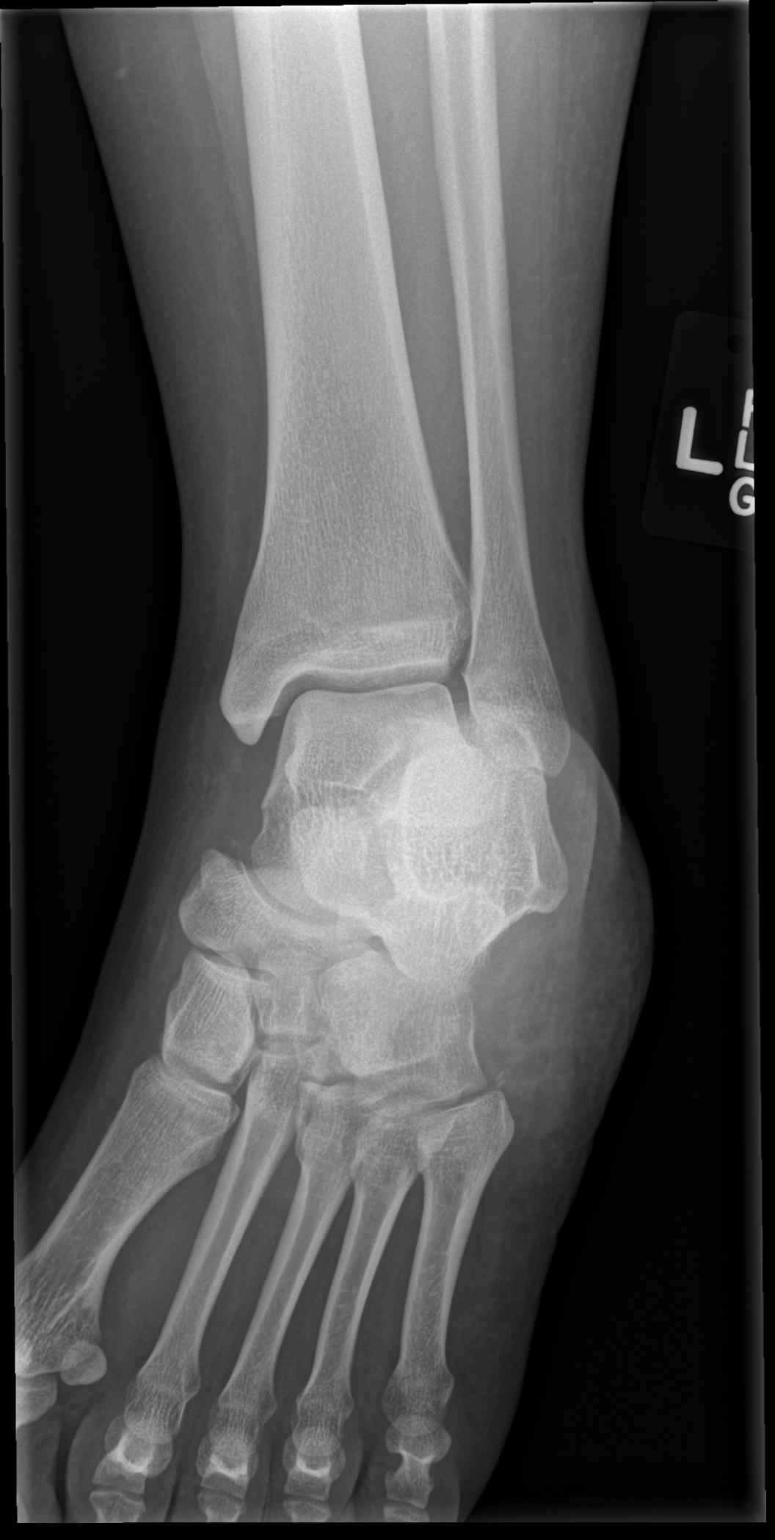

[x ankle lat left]
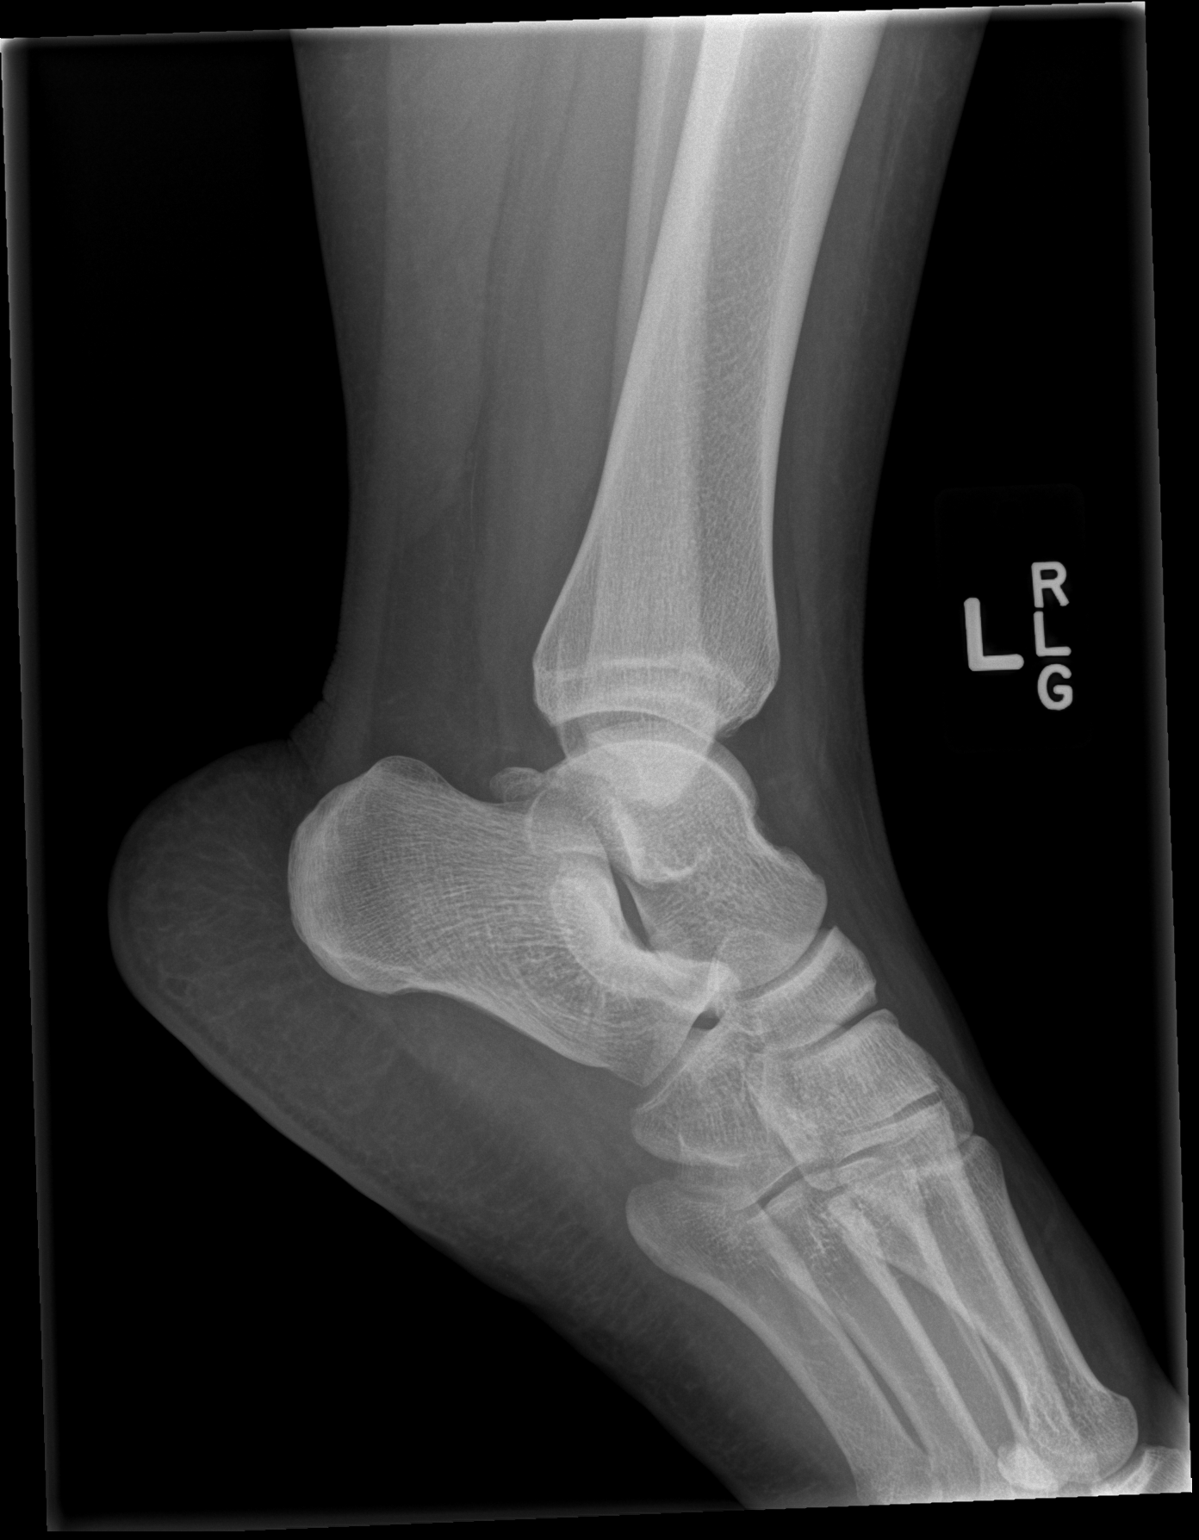

[3 of 3 positions shown; findings below may reference images not displayed]

FINDINGS: Small avulsion fracture fragments near the fifth metatarsal base and
lateral cuboid. No fracture or malalignment at the ankle
IMPRESSION: Small avulsion fracture from the fifth metatarsal base or lateral
cuboid.

## 2021-03-19 ENCOUNTER — Encounter: Payer: Medicaid Other | Admitting: Internal Medicine

## 2021-03-22 ENCOUNTER — Encounter: Payer: Self-pay | Admitting: Internal Medicine

## 2021-03-22 ENCOUNTER — Ambulatory Visit (INDEPENDENT_AMBULATORY_CARE_PROVIDER_SITE_OTHER): Payer: Medicaid Other | Admitting: Internal Medicine

## 2021-03-22 ENCOUNTER — Other Ambulatory Visit: Payer: Self-pay

## 2021-03-22 VITALS — BP 129/68 | HR 87 | Temp 98.0°F | Ht 61.0 in | Wt 293.7 lb

## 2021-03-22 DIAGNOSIS — R5383 Other fatigue: Secondary | ICD-10-CM | POA: Diagnosis not present

## 2021-03-22 DIAGNOSIS — R5382 Chronic fatigue, unspecified: Secondary | ICD-10-CM | POA: Diagnosis not present

## 2021-03-22 DIAGNOSIS — Z0000 Encounter for general adult medical examination without abnormal findings: Secondary | ICD-10-CM

## 2021-03-22 DIAGNOSIS — Z713 Dietary counseling and surveillance: Secondary | ICD-10-CM

## 2021-03-22 DIAGNOSIS — D509 Iron deficiency anemia, unspecified: Secondary | ICD-10-CM | POA: Diagnosis not present

## 2021-03-22 LAB — POCT GLYCOSYLATED HEMOGLOBIN (HGB A1C): Hemoglobin A1C: 5.1 % (ref 4.0–5.6)

## 2021-03-22 LAB — GLUCOSE, CAPILLARY: Glucose-Capillary: 90 mg/dL (ref 70–99)

## 2021-03-22 MED ORDER — OZEMPIC (0.25 OR 0.5 MG/DOSE) 2 MG/1.5ML ~~LOC~~ SOPN: 0.5000 mg | PEN_INJECTOR | SUBCUTANEOUS | 2 refills | Status: DC

## 2021-03-22 NOTE — Patient Instructions (Addendum)
Dear Mrs. Cheryl Nolan, ? ?Thank you for trusting [place] with your care today. We discussed your weight loss and fatigue. We have sent in a prescription for Ozempic to help with weight loss.  ? ?For Ozempic ? ?Week 1 through week 4 : 0.25 mg once weekly. ?Week 5 through week 8: 0.5 mg once weekly. ?Week 9 through week 12: 1 mg once weekly. ?Week 13 through week 16: 1.7 mg once weekly. ?Week 17 and thereafter (maintenance dosage): 2.4 mg once weekly ? ?For your fatigue, ? We are checking an iron panel, your blood cell counts, and thyroid levels. We will call you with any abnormal results.  ?Please return in 1 month for a follow up to see how well you are doing with the medication.  ? ?

## 2021-03-22 NOTE — Progress Notes (Signed)
? ?  CC: weight loss management ? ?HPI:[name] is a 31 y.o. female who presents for evaluation of weight loss. Please see individual problem based A/P for details. ? ?Depression, PHQ-9: ?Based on the patients  ?[place] Office Visit from 03/22/2021 in [place]  ?PHQ-9 Total Score 0  ? ?  ? score we have 0. ? ?Past Medical History:  ?Diagnosis Date  ? Hypertension   ? ?Review of Systems:   ?Review of Systems  ?Constitutional:  Positive for malaise/fatigue.  ?HENT: Negative.    ?Eyes: Negative.   ?Respiratory: Negative.    ?Cardiovascular: Negative.   ?Gastrointestinal: Negative.   ?Genitourinary: Negative.   ?Musculoskeletal: Negative.   ?Skin: Negative.   ?Neurological: Negative.   ?Endo/Heme/Allergies: Negative.   ?Psychiatric/Behavioral: Negative.     ? ?Physical Exam: ?Vitals:  ? 03/22/21 0923  ?BP: 129/68  ?Pulse: 87  ?Temp: 98 ?F (36.7 ?C)  ?TempSrc: Oral  ?SpO2: 100%  ?Weight: 293 lb 11.2 oz (133.2 kg)  ?Height: 5\' 1"  (1.549 m)  ? ? ? ?General: alert and oriented, obese ?HEENT: Conjunctiva nl , antiicteric sclerae, moist mucous membranes, no exudate or erythema ?Cardiovascular: Normal rate, regular rhythm.  No murmurs, rubs, or gallops ?Pulmonary : Equal breath sounds, No wheezes, rales, or rhonchi ?Abdominal: soft, nontender,  bowel sounds present ?Ext: No edema in lower extremities, no tenderness to palpation of lower extremities.  ? ?Assessment & Plan:  ? ?See Encounters Tab for problem based charting. ? ?Patient discussed with Dr. Dareen Piano ? ?

## 2021-03-23 ENCOUNTER — Encounter: Payer: Self-pay | Admitting: Internal Medicine

## 2021-03-23 ENCOUNTER — Telehealth: Payer: Self-pay | Admitting: *Deleted

## 2021-03-23 DIAGNOSIS — R5383 Other fatigue: Secondary | ICD-10-CM | POA: Insufficient documentation

## 2021-03-23 DIAGNOSIS — D509 Iron deficiency anemia, unspecified: Secondary | ICD-10-CM | POA: Insufficient documentation

## 2021-03-23 MED ORDER — FERROUS SULFATE 325 (65 FE) MG PO TABS: 325.0000 mg | ORAL_TABLET | Freq: Every day | ORAL | 3 refills | Status: DC

## 2021-03-23 NOTE — Progress Notes (Signed)
Internal Medicine Clinic Attending ? ?Case discussed with Dr. Burnice Logan  At the time of the visit.  We reviewed the resident?s history and exam and pertinent patient test results.  I agree with the assessment, diagnosis, and plan of care documented in the resident?s note. ? ?Patient with worsening anemia with Hb of 7.1. Will arrange for iron and blood transfusion. Patient denied heavy menses to resident. Will have them f/u with GI to rule out GI source ?

## 2021-03-23 NOTE — Assessment & Plan Note (Signed)
-   weight today is 293, which is unchanged from 10 months ago. She is still very busy and has a difficult time adhering to diet and exercise due to very busy schedule. 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  ?- she has been trying to manage with diet annd exercise (low fat/calorie mediterranean diet, 150 minutes exercise) ?- she was referred to nutritionist ([facility]) last OV but has not been seen yet. Given their number to call.  ?- Patient had previously wanted to try to lose weight via diet and exercise and had declined starting medications. Today she is willing to try medication. We will start her on Ozempic and increase dose as she tolerates ?

## 2021-03-23 NOTE — Telephone Encounter (Signed)
Appointment Information  ?Name: Cheryl Nolan, Cheryl Nolan MRN: [id number]  ?Date: 03/26/2021 Status: Sch  ?Time: 8:00 AM Length: 240  ?Visit Type: BLOOD TRANSFUSION 240 [415] Copay: $0.00  ?Provider: [id number] Department: [facility]  ?Referring Provider: Virl Axe CSN: [id number]  ?Notes: 1x [id number] 1x iv Ferrelicit  ?Made On: 03/23/2021 12:26 PM By: Nicholes Stairs D  ? ? ?Pt aware of appt ? ?

## 2021-03-23 NOTE — Assessment & Plan Note (Signed)
Patient has not received either COVID or Flu shot. Offered, however, she declined both.  ?

## 2021-03-23 NOTE — Assessment & Plan Note (Addendum)
Patient has a hx of iron deficiency anemia and had previously required transfusions during pregnancy. She has also required IV iron in the past as well. ?CBC showed hgb 7.1 and patient reporting fatigue. Fatigue is chronic, and she is otherwise hemodynamically stable. Denies any melena, hematochezia, hematemesis, or hematuria. ?- Called and updated patient on lab results.  ?- orders have been placed to have her scheduled for blood transfusion, IV iron.  ?- She has been started on iron supplementation as well. ?- Referral placed to GI for EGD and colon to evaluate for possible GI losses. ?

## 2021-03-23 NOTE — Assessment & Plan Note (Addendum)
Patient reporting chronic fatigue for several years. ?Lab work has shown hgb 7.1. previously 10. She does have a hx of iron deficiency anemia, and has required iron transfusions in the past.  ?She will receive rbc and iron transfusion and be started on iron supplementation. She will also need to follow up with GI.  ?TSH and iron panel pending. Will follow up remaining labs as they return.  ?

## 2021-03-24 LAB — IRON,TIBC AND FERRITIN PANEL
Ferritin: 8 ng/mL — ABNORMAL LOW (ref 15–150)
Iron Saturation: 5 % — CL (ref 15–55)
Iron: 17 ug/dL — ABNORMAL LOW (ref 27–159)
Total Iron Binding Capacity: 312 ug/dL (ref 250–450)
UIBC: 295 ug/dL (ref 131–425)

## 2021-03-24 LAB — CBC WITH DIFFERENTIAL/PLATELET
Basophils Absolute: 0.1 10*3/uL (ref 0.0–0.2)
Basos: 1 %
EOS (ABSOLUTE): 0.1 10*3/uL (ref 0.0–0.4)
Eos: 1 %
Hematocrit: 25.1 % — ABNORMAL LOW (ref 34.0–46.6)
Hemoglobin: 7.1 g/dL — ABNORMAL LOW (ref 11.1–15.9)
Immature Grans (Abs): 0.1 10*3/uL (ref 0.0–0.1)
Immature Granulocytes: 1 %
Lymphocytes Absolute: 2.7 10*3/uL (ref 0.7–3.1)
Lymphs: 34 %
MCH: 16.7 pg — ABNORMAL LOW (ref 26.6–33.0)
MCHC: 28.3 g/dL — ABNORMAL LOW (ref 31.5–35.7)
MCV: 59 fL — ABNORMAL LOW (ref 79–97)
Monocytes Absolute: 0.5 10*3/uL (ref 0.1–0.9)
Monocytes: 6 %
Neutrophils Absolute: 4.6 10*3/uL (ref 1.4–7.0)
Neutrophils: 57 %
Platelets: 443 10*3/uL (ref 150–450)
RBC: 4.26 x10E6/uL (ref 3.77–5.28)
RDW: 20.1 % — ABNORMAL HIGH (ref 11.7–15.4)
WBC: 7.9 10*3/uL (ref 3.4–10.8)

## 2021-03-24 LAB — CMP14 + ANION GAP
ALT: 9 IU/L (ref 0–32)
AST: 12 IU/L (ref 0–40)
Albumin/Globulin Ratio: 1.4 (ref 1.2–2.2)
Albumin: 4.2 g/dL (ref 3.9–5.0)
Alkaline Phosphatase: 59 IU/L (ref 44–121)
Anion Gap: 10 mmol/L (ref 10.0–18.0)
BUN/Creatinine Ratio: 12 (ref 9–23)
BUN: 9 mg/dL (ref 6–20)
Bilirubin Total: <0.2 mg/dL (ref 0.0–1.2)
CO2: 23 mmol/L (ref 20–29)
Calcium: 8.9 mg/dL (ref 8.7–10.2)
Chloride: 102 mmol/L (ref 96–106)
Creatinine, Ser: 0.76 mg/dL (ref 0.57–1.00)
Globulin, Total: 3 g/dL (ref 1.5–4.5)
Glucose: 84 mg/dL (ref 70–99)
Potassium: 3.9 mmol/L (ref 3.5–5.2)
Sodium: 135 mmol/L (ref 134–144)
Total Protein: 7.2 g/dL (ref 6.0–8.5)
eGFR: 109 mL/min/{1.73_m2} (ref >59)

## 2021-03-24 LAB — TSH: TSH: 2.16 u[IU]/mL (ref 0.450–4.500)

## 2021-03-26 ENCOUNTER — Inpatient Hospital Stay (HOSPITAL_COMMUNITY): Admission: RE | Admit: 2021-03-26 | Payer: Medicaid Other | Source: Ambulatory Visit

## 2021-03-27 ENCOUNTER — Telehealth: Payer: Self-pay | Admitting: *Deleted

## 2021-03-27 NOTE — Telephone Encounter (Signed)
Call from patient was unable t keep appointment for Blood Transfusion on Monday.  Wants to reschedule.  Patient was given number to Short Stay to reschedule [phone]. ?

## 2021-03-29 ENCOUNTER — Other Ambulatory Visit: Payer: Self-pay

## 2021-03-29 DIAGNOSIS — Z713 Dietary counseling and surveillance: Secondary | ICD-10-CM

## 2021-03-30 MED ORDER — OZEMPIC (0.25 OR 0.5 MG/DOSE) 2 MG/1.5ML ~~LOC~~ SOPN: 0.5000 mg | PEN_INJECTOR | SUBCUTANEOUS | 2 refills | Status: DC

## 2021-04-06 ENCOUNTER — Inpatient Hospital Stay (HOSPITAL_COMMUNITY): Admission: RE | Admit: 2021-04-06 | Payer: Medicaid Other | Source: Ambulatory Visit

## 2021-04-09 ENCOUNTER — Telehealth: Payer: Self-pay

## 2021-04-09 NOTE — Telephone Encounter (Signed)
Return pt's call - stated she was informed by [facility] a PA is needed. ? ?I called [facility] - stated the rx has Wegovy directions but the medication is Ozempic. ?

## 2021-04-09 NOTE — Telephone Encounter (Signed)
Pt called stated that the [facility] )  that was sent in the pharmacy stated that [facility] code  was incorrect and she is also needing a PA ... I explained that I am the one who does the PA and per her chart  she is not diabetic type 2   ?

## 2021-04-10 ENCOUNTER — Telehealth: Payer: Self-pay

## 2021-04-10 NOTE — Telephone Encounter (Signed)
PA  for pt [facility] )  came through  on cover my meds. Was submitted  with office notes waiting approval or denial ... ? ? ? ? FYI : pt is not diabetic   so chances of this medication being approved are very low  ?

## 2021-04-10 NOTE — Telephone Encounter (Signed)
DECISION : ? ? ? DENIED  ? ? ? ? REASON Being  pt is not diabetic  ? ? ? (COPY  PLACED TO BE SCANNED TO CHART )  ?

## 2021-04-11 NOTE — Telephone Encounter (Signed)
Called and discussed with patient. She understands and will follow up with Weight/nutrition clinic as previously discussed.

## 2021-10-16 ENCOUNTER — Ambulatory Visit: Payer: Medicaid Other | Admitting: Student

## 2021-10-16 VITALS — BP 139/82 | HR 96 | Temp 98.2°F | Ht 61.0 in | Wt 277.3 lb

## 2021-10-16 DIAGNOSIS — D509 Iron deficiency anemia, unspecified: Secondary | ICD-10-CM

## 2021-10-16 DIAGNOSIS — Z23 Encounter for immunization: Secondary | ICD-10-CM | POA: Diagnosis not present

## 2021-10-16 DIAGNOSIS — Z6841 Body Mass Index (BMI) 40.0 and over, adult: Secondary | ICD-10-CM | POA: Diagnosis not present

## 2021-10-16 DIAGNOSIS — Z0000 Encounter for general adult medical examination without abnormal findings: Secondary | ICD-10-CM

## 2021-10-16 DIAGNOSIS — Z111 Encounter for screening for respiratory tuberculosis: Secondary | ICD-10-CM

## 2021-10-16 DIAGNOSIS — E66813 Obesity, class 3: Secondary | ICD-10-CM

## 2021-10-16 MED ORDER — POLYSACCHARIDE IRON COMPLEX 150 MG PO TABS: 1.0000 | ORAL_TABLET | Freq: Every day | ORAL | 2 refills | Status: AC

## 2021-10-16 NOTE — Patient Instructions (Addendum)
Cheryl Nolan,  It was a pleasure seeing you in the clinic today.   We are checking your blood count and iron levels, but I believe that your iron may still be low. I have prescribed you iron supplements, please make sure to take 1 tablet every day. We have performed your physical exam today. We performed your Tuberculosis screening test today. I will call you once I have the results. Please come back to see [place] in 1 month.  Please call our clinic at [phone] if you have any questions or concerns. The best time to call is Monday-Friday from 9am-4pm, but there is someone available 24/7 at the same number. If you need medication refills, please notify your pharmacy one week in advance and they will send [place] a request.   Thank you for letting us take part in your care. We look forward to seeing you next time!

## 2021-10-16 NOTE — Assessment & Plan Note (Signed)
Patient living with iron deficiency anemia. She had a Hgb of 7.1 and ferritin of 8 when last checked in 03/2021. She has not been on iron supplementation since that time and was unable to make the blood transfusion that was scheduled for her x2.   Notes that her menstrual cycles are pretty regular for her (7 days every month). She does note that she has heavy flow every other month where she will need to change her pad every 30 minutes to an hour. Her only current symptom is intermittent headaches, which is usually her main symptom when her iron levels are low. 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 She is very receptive of this and wants to make sure her iron is replete.   We will recheck her iron levels and a CBC as well. While these are pending, will prescribe a polysaccharide formulation of iron to begin supplementation.  Plan: -f/u CBC, iron studies -begin polysaccharide iron complex at 150mg  every other day -f/u in 1 month  ADDENDUM: Patient with critically low hemoglobin value of 6.1 with low MCV and high RDW, likely reflecting significant anemia 2/2 severe iron deficiency. I have been unable to contact patient x2. Spoke with sister, Paulette Blanch, who states that patient is in class and will not pick up at this time. I gave my personal phone number for call-back and discussed need for patient to go to ED for further management. I suspect that patient will need a blood transfusion and may potentially need an iron infusion for severely depleted iron stores.   ADDENDUM: Was able to contact [name] at [facility]. We discussed her lab results and I advised her to promptly go to the ED for further evaluation and need for a blood transfusion. She is currently asymptomatic aside from an intermittent headache, which she mentioned during our visit yesterday. She confirms  understanding that her blood counts are critically low and will go to the ED for further evaluation.

## 2021-10-16 NOTE — Assessment & Plan Note (Signed)
Patient is attending [place] and requires TB screening with Quantiferon testing. Performed today.  Plan: -f/u Quantiferon Gold testing

## 2021-10-16 NOTE — Progress Notes (Signed)
   CC: f/u of iron deficiency anemia  HPI:  [name] is a 31 y.o. female with history listed below presenting to the [facility] for f/u of iron deficiency anemia. Please see individualized problem based charting for full HPI.  Past Medical History:  Diagnosis Date   Hypertension     Review of Systems:  Negative aside from that listed in individualized problem based charting.  Physical Exam:  Vitals:   10/16/21 1554  BP: 139/82  Pulse: 96  Temp: 98.2 F (36.8 C)  TempSrc: Oral  SpO2: 100%  Weight: 277 lb 4.8 oz (125.8 kg)  Height: 5\' 1"  (1.549 m)   Physical Exam Constitutional:      Appearance: She is obese. She is not ill-appearing.  HENT:     Right Ear: Tympanic membrane, ear canal and external ear normal.     Left Ear: Tympanic membrane, ear canal and external ear normal.     Nose: Nose normal. No congestion.     Mouth/Throat:     Mouth: Mucous membranes are moist.     Pharynx: Oropharynx is clear. No oropharyngeal exudate.  Eyes:     General: No scleral icterus.    Extraocular Movements: Extraocular movements intact.     Conjunctiva/sclera: Conjunctivae normal.     Pupils: Pupils are equal, round, and reactive to light.  Cardiovascular:     Rate and Rhythm: Normal rate and regular rhythm.     Pulses: Normal pulses.     Heart sounds: Normal heart sounds. No murmur heard.    No friction rub. No gallop.  Pulmonary:     Effort: Pulmonary effort is normal.     Breath sounds: Normal breath sounds. No wheezing, rhonchi or rales.  Abdominal:     General: Bowel sounds are normal. There is no distension.     Palpations: Abdomen is soft.     Tenderness: There is no abdominal tenderness. There is no guarding or rebound.  Musculoskeletal:        General: No swelling, tenderness or deformity. Normal range of motion.  Skin:    General: Skin is warm and dry.     Findings: No lesion or rash.  Neurological:     General: No focal deficit present.     Mental Status: She  is alert and oriented to person, place, and time.  Psychiatric:        Mood and Affect: Mood normal.        Behavior: Behavior normal.      Assessment & Plan:   See Encounters Tab for problem based charting.  Patient discussed with Dr. Evette Doffing

## 2021-10-16 NOTE — Assessment & Plan Note (Signed)
Patient with BMI 52.40. She has lost about 16lbs since last visit in 03/2021, primarily with dietary modifications alone. She has not yet been able to incorporate exercise into lifestyle given how busy she is, but she does understand the importance of it. She will try to perform at least 150 minutes of moderate intensity exercise per week. Overall, she is optimistic about losing weight and has set goals for herself, which I encouraged.

## 2021-10-16 NOTE — Assessment & Plan Note (Signed)
Received flu shot today. 

## 2021-10-17 ENCOUNTER — Other Ambulatory Visit: Payer: Self-pay

## 2021-10-17 ENCOUNTER — Encounter (HOSPITAL_COMMUNITY): Payer: Self-pay | Admitting: Emergency Medicine

## 2021-10-17 ENCOUNTER — Emergency Department (HOSPITAL_COMMUNITY)
Admission: EM | Admit: 2021-10-17 | Discharge: 2021-10-18 | Disposition: A | Discharge status: Home / Self Care | Payer: Medicaid Other | Attending: Emergency Medicine | Admitting: Emergency Medicine

## 2021-10-17 ENCOUNTER — Telehealth: Payer: Self-pay | Admitting: Student

## 2021-10-17 DIAGNOSIS — R5383 Other fatigue: Secondary | ICD-10-CM | POA: Diagnosis not present

## 2021-10-17 DIAGNOSIS — R42 Dizziness and giddiness: Secondary | ICD-10-CM | POA: Diagnosis not present

## 2021-10-17 DIAGNOSIS — N9489 Other specified conditions associated with female genital organs and menstrual cycle: Secondary | ICD-10-CM | POA: Diagnosis not present

## 2021-10-17 DIAGNOSIS — I10 Essential (primary) hypertension: Secondary | ICD-10-CM | POA: Diagnosis not present

## 2021-10-17 DIAGNOSIS — D508 Other iron deficiency anemias: Secondary | ICD-10-CM | POA: Insufficient documentation

## 2021-10-17 LAB — COMPREHENSIVE METABOLIC PANEL
ALT: 11 U/L (ref 0–44)
AST: 11 U/L — ABNORMAL LOW (ref 15–41)
Albumin: 3.7 g/dL (ref 3.5–5.0)
Alkaline Phosphatase: 47 U/L (ref 38–126)
Anion gap: 6 (ref 5–15)
BUN: 10 mg/dL (ref 6–20)
CO2: 23 mmol/L (ref 22–32)
Calcium: 9.2 mg/dL (ref 8.9–10.3)
Chloride: 110 mmol/L (ref 98–111)
Creatinine, Ser: 0.85 mg/dL (ref 0.44–1.00)
GFR, Estimated: >60 mL/min (ref >60)
Glucose, Bld: 89 mg/dL (ref 70–99)
Potassium: 3.8 mmol/L (ref 3.5–5.1)
Sodium: 139 mmol/L (ref 135–145)
Total Bilirubin: 0.2 mg/dL — ABNORMAL LOW (ref 0.3–1.2)
Total Protein: 7.5 g/dL (ref 6.5–8.1)

## 2021-10-17 LAB — CBC
HCT: 22.3 % — ABNORMAL LOW (ref 36.0–46.0)
Hemoglobin: 6 g/dL — CL (ref 12.0–15.0)
MCH: 15.1 pg — ABNORMAL LOW (ref 26.0–34.0)
MCHC: 26.9 g/dL — ABNORMAL LOW (ref 30.0–36.0)
MCV: 56.2 fL — ABNORMAL LOW (ref 80.0–100.0)
Platelets: 469 10*3/uL — ABNORMAL HIGH (ref 150–400)
RBC: 3.97 MIL/uL (ref 3.87–5.11)
RDW: 21 % — ABNORMAL HIGH (ref 11.5–15.5)
WBC: 10.9 10*3/uL — ABNORMAL HIGH (ref 4.0–10.5)
nRBC: 0 % (ref 0.0–0.2)

## 2021-10-17 LAB — I-STAT BETA HCG BLOOD, ED (MC, WL, AP ONLY): I-stat hCG, quantitative: <5 m[IU]/mL (ref <5)

## 2021-10-17 LAB — PREPARE RBC (CROSSMATCH)

## 2021-10-17 MED ORDER — SODIUM CHLORIDE 0.9% IV SOLUTION: Freq: Once | INTRAVENOUS | Status: DC

## 2021-10-17 NOTE — Progress Notes (Signed)
Patient called.  Patient aware of critically low Hgb level of 6.1. I advised her to promptly go to the ED for need of blood transfusion and possible iron infusion (iron studies are pending but she likely has severe iron deficiency). She confirmed understanding and will go to the ED at this time.

## 2021-10-17 NOTE — Telephone Encounter (Signed)
Unable to reach Ms. Cheaney via phone call x2. Spoke with alternate contact, Dysheka Bibbs (sister), and discussed need to speak with Ms. Depasquale and to direct her to ED. Sister reports that patient is in class and unable to answer any calls currently. I have given her my personal contact number in hopes of getting into contact with Ms. Condrey regarding her critical lab value of Hgb 6.1. I believe that patient's anemia is most likely 2/2 severe iron deficiency anemia given low MCV and high RDW. Awaiting call back from patient and will direct her to ED given need for likely blood transfusion and possibly iron infusion alongside this.   Plan: -direct patient to ED for blood transfusion +/- iron infusion -iron studies pending

## 2021-10-17 NOTE — ED Provider Triage Note (Signed)
Emergency Medicine Provider Triage Evaluation Note  Cheryl Nolan , a 31 y.o. female  was evaluated in triage.  Pt complains of low hemoglobin.  Had a physical yesterday and was called by her PCP and told her that she had a hemoglobin of six.  Has been feeling more fatigued than usual.  She does have heavy fluid periods.  She has had anemia in the past but not had a blood transfusion.  She denies feeling of pruritus presyncope.  Review of Systems  Positive: Low hemoglobin Negative: Abdominal pain  Physical Exam  BP (!) 144/88 (BP Location: Left Arm)   Pulse 98   Temp 98.6 F (37 C) (Oral)   Resp (!) 22   SpO2 99%  Gen:   Awake, no distress   Resp:  Normal effort  MSK:   Moves extremities without difficulty  Other:  No tachycardia  Medical Decision Making  Medically screening exam initiated at 9:12 PM.  Appropriate orders placed.  Cheryl Nolan was informed that the remainder of the evaluation will be completed by another provider, this initial triage assessment does not replace that evaluation, and the importance of remaining in the ED until their evaluation is complete.  Work-up initiated`   Margarita Mail, PA-C 10/17/21 2114

## 2021-10-17 NOTE — ED Provider Notes (Signed)
[facility] EMERGENCY DEPARTMENT Provider Note   CSN: [id number] Arrival date & time: 10/17/21  1926     History {Add pertinent medical, surgical, social history, OB history to HPI:1} Chief Complaint  Patient presents with   low hemoglobin     Cheryl Nolan is a 31 y.o. female.  The history is provided by the patient and medical records.   31 y.o. F with hx of obesity, Fe+ deficiency anemia, HTN, presenting to the ED feeling poorly for the past month.  States after having her children she has been having heavier periods.  She is not currently on OCP's or other form of birth control.  She states since menstrual cycle last month she has been feeling lightheaded and fatigued, no syncopal events.  She does report hx of Fe+ deficiency anemia, not currently taking Fe+ supplements.  Does have hx of transfusion twice in the past-- one during childbirth and other due to anemia.  She is not currently followed by OB-GYN.  Home Medications Prior to Admission medications   Medication Sig Start Date End Date Taking? Authorizing Provider  Polysaccharide Iron Complex 150 MG TABS Take 1 tablet by mouth daily. 10/16/21   Virl Axe, MD      Allergies    Patient has no known allergies.    Review of Systems   Review of Systems  Neurological:  Positive for light-headedness.  All other systems reviewed and are negative.   Physical Exam Updated Vital Signs BP (!) 139/92 (BP Location: Left Arm)   Pulse 79   Temp 98.3 F (36.8 C) (Oral)   Resp 15   SpO2 100%   Physical Exam Vitals and nursing note reviewed.  Constitutional:      Appearance: She is well-developed.  HENT:     Head: Normocephalic and atraumatic.  Eyes:     Conjunctiva/sclera: Conjunctivae normal.     Pupils: Pupils are equal, round, and reactive to light.     Comments: Pale conjunctiva  Cardiovascular:     Rate and Rhythm: Normal rate and regular rhythm.     Heart sounds: Normal heart sounds.   Pulmonary:     Effort: Pulmonary effort is normal. No respiratory distress.     Breath sounds: Normal breath sounds. No rhonchi.  Abdominal:     General: Bowel sounds are normal.     Palpations: Abdomen is soft.     Tenderness: There is no abdominal tenderness. There is no rebound.  Musculoskeletal:        General: Normal range of motion.     Cervical back: Normal range of motion.  Skin:    General: Skin is warm and dry.  Neurological:     Mental Status: She is alert and oriented to person, place, and time.     ED Results / Procedures / Treatments   Labs (all labs ordered are listed, but only abnormal results are displayed) Labs Reviewed  COMPREHENSIVE METABOLIC PANEL - Abnormal; Notable for the following components:      Result Value   AST 11 (*)    Total Bilirubin 0.2 (*)    All other components within normal limits  CBC - Abnormal; Notable for the following components:   WBC 10.9 (*)    Hemoglobin 6.0 (*)    HCT 22.3 (*)    MCV 56.2 (*)    MCH 15.1 (*)    MCHC 26.9 (*)    RDW 21.0 (*)    Platelets 469 (*)  All other components within normal limits  VITAMIN B12  FOLATE  IRON AND TIBC  FERRITIN  RETICULOCYTES  I-STAT BETA HCG BLOOD, ED ([facility], [facility], [facility] ONLY)  TYPE AND SCREEN  PREPARE RBC (CROSSMATCH)    EKG None  Radiology No results found.  Procedures Procedures  {Document cardiac monitor, telemetry assessment procedure when appropriate:1}  Medications Ordered in ED Medications  0.9 %  sodium chloride infusion (Manually program via Guardrails IV Fluids) (0 mLs Intravenous Hold 10/17/21 2341)    ED Course/ Medical Decision Making/ A&P Clinical Course as of 10/17/21 2338  Wed Oct 17, 2021  2302 Hemoglobin(!!): 6.0 [JL]    Clinical Course User Index [JL] Regan Lemming, MD                           Medical Decision Making Amount and/or Complexity of Data Reviewed Labs: ordered. Decision-making details documented in ED Course.  Risk Prescription  drug management.   ***  {Document critical care time when appropriate:1} {Document review of labs and clinical decision tools ie heart score, Chads2Vasc2 etc:1}  {Document your independent review of radiology images, and any outside records:1} {Document your discussion with family members, caretakers, and with consultants:1} {Document social determinants of health affecting pt's care:1} {Document your decision making why or why not admission, treatments were needed:1} Final Clinical Impression(s) / ED Diagnoses Final diagnoses:  None    Rx / DC Orders ED Discharge Orders     None

## 2021-10-17 NOTE — ED Triage Notes (Signed)
Patient referred here by PCP due to her hemoglobin being below 7. Patient saw her PCP for a yearly physical for school but states she has been feeling fatigued and light headed since her menstrual cycle started in August, stopped but then came back again September 8th and experienced heavy bleeding. PT Aox4. NAD at this time.

## 2021-10-17 NOTE — Progress Notes (Signed)
Internal Medicine Clinic Attending  Case discussed with Dr. Jinwala  At the time of the visit.  We reviewed the resident's history and exam and pertinent patient test results.  I agree with the assessment, diagnosis, and plan of care documented in the resident's note.  

## 2021-10-18 LAB — CBC
Hematocrit: 23 % — ABNORMAL LOW (ref 34.0–46.6)
Hemoglobin: 6.1 g/dL — CL (ref 11.1–15.9)
MCH: 15 pg — ABNORMAL LOW (ref 26.6–33.0)
MCHC: 26.5 g/dL — ABNORMAL LOW (ref 31.5–35.7)
MCV: 57 fL — ABNORMAL LOW (ref 79–97)
Platelets: 555 10*3/uL — ABNORMAL HIGH (ref 150–450)
RBC: 4.07 x10E6/uL (ref 3.77–5.28)
RDW: 19.8 % — ABNORMAL HIGH (ref 11.7–15.4)
WBC: 9.7 10*3/uL (ref 3.4–10.8)

## 2021-10-18 LAB — IRON,TIBC AND FERRITIN PANEL
Ferritin: 5 ng/mL — ABNORMAL LOW (ref 15–150)
Iron Saturation: 3 % — CL (ref 15–55)
Iron: 10 ug/dL — ABNORMAL LOW (ref 27–159)
Total Iron Binding Capacity: 371 ug/dL (ref 250–450)
UIBC: 361 ug/dL (ref 131–425)

## 2021-10-18 LAB — IRON AND TIBC
Iron: 17 ug/dL — ABNORMAL LOW (ref 28–170)
Saturation Ratios: 4 % — ABNORMAL LOW (ref 10.4–31.8)
TIBC: 421 ug/dL (ref 250–450)
UIBC: 404 ug/dL

## 2021-10-18 LAB — RETICULOCYTES
Immature Retic Fract: 29.4 % — ABNORMAL HIGH (ref 2.3–15.9)
RBC.: 3.95 MIL/uL (ref 3.87–5.11)
Retic Count, Absolute: 46.6 10*3/uL (ref 19.0–186.0)
Retic Ct Pct: 1.2 % (ref 0.4–3.1)

## 2021-10-18 LAB — FOLATE: Folate: 9.2 ng/mL (ref >5.9)

## 2021-10-18 LAB — VITAMIN B12: Vitamin B-12: 293 pg/mL (ref 180–914)

## 2021-10-18 LAB — FERRITIN: Ferritin: 6 ng/mL — ABNORMAL LOW (ref 11–307)

## 2021-10-18 MED ORDER — FERROUS SULFATE 325 (65 FE) MG PO TABS: 325.0000 mg | ORAL_TABLET | Freq: Every day | ORAL | 0 refills | Status: AC

## 2021-10-18 NOTE — ED Notes (Signed)
Pt educated on s/s of rxn. Pt stated understanding of education. Pt stated she would notify RN immediately if she develops any s/s of rxn. RN at bedside.

## 2021-10-18 NOTE — Discharge Instructions (Addendum)
  Your iron was found to be low today.  I would re-start iron supplements which I have prescribed for you. Also recommend that you see GYN for management of your heavy periods-- call for appt. Return here for new concerns.

## 2021-10-18 NOTE — ED Notes (Signed)
Pt denies s/s of rxn. Pt AAOx4. Pt in no acute distress. Vital signs stable at this time.

## 2021-10-19 LAB — TYPE AND SCREEN
ABO/RH(D): A NEG
Antibody Screen: NEGATIVE
Unit division: 0

## 2021-10-19 LAB — BPAM RBC
Blood Product Expiration Date: 202310262359
ISSUE DATE / TIME: 202309280101
Unit Type and Rh: 600

## 2021-10-22 ENCOUNTER — Telehealth: Payer: Self-pay

## 2021-10-22 LAB — QUANTIFERON-TB GOLD PLUS
QuantiFERON Mitogen Value: >10 IU/mL
QuantiFERON Nil Value: 0 IU/mL
QuantiFERON TB1 Ag Value: 0 IU/mL
QuantiFERON TB2 Ag Value: 0.01 IU/mL
QuantiFERON-TB Gold Plus: NEGATIVE

## 2021-10-22 NOTE — Progress Notes (Signed)
Patient called.  Patient aware. She would like to pick up test results to provide to her school and will come to [facility] for a physical copy.

## 2021-10-22 NOTE — Telephone Encounter (Signed)
Requesting lab results, please call pt back.  

## 2022-06-24 ENCOUNTER — Encounter: Payer: Self-pay | Admitting: *Deleted

## 2023-06-27 ENCOUNTER — Encounter: Payer: Self-pay | Admitting: *Deleted

## 2023-07-09 ENCOUNTER — Encounter: Payer: Self-pay | Admitting: Student
